# Patient Record
Sex: Female | Born: 1955 | Race: Black or African American | Hispanic: No | Marital: Married | Smoking: Never smoker
Health system: Southern US, Community
[De-identification: ages and names within clinical notes are randomized; demographics above are authoritative.]

## PROBLEM LIST (undated history)

## (undated) DIAGNOSIS — N611 Abscess of the breast and nipple: Secondary | ICD-10-CM

## (undated) DIAGNOSIS — Z8719 Personal history of other diseases of the digestive system: Secondary | ICD-10-CM

## (undated) DIAGNOSIS — J302 Other seasonal allergic rhinitis: Secondary | ICD-10-CM

## (undated) DIAGNOSIS — K219 Gastro-esophageal reflux disease without esophagitis: Secondary | ICD-10-CM

## (undated) DIAGNOSIS — R011 Cardiac murmur, unspecified: Secondary | ICD-10-CM

## (undated) DIAGNOSIS — M25569 Pain in unspecified knee: Secondary | ICD-10-CM

## (undated) DIAGNOSIS — R17 Unspecified jaundice: Secondary | ICD-10-CM

## (undated) DIAGNOSIS — T7840XA Allergy, unspecified, initial encounter: Secondary | ICD-10-CM

## (undated) DIAGNOSIS — L309 Dermatitis, unspecified: Secondary | ICD-10-CM

## (undated) DIAGNOSIS — R519 Headache, unspecified: Secondary | ICD-10-CM

## (undated) DIAGNOSIS — E039 Hypothyroidism, unspecified: Secondary | ICD-10-CM

## (undated) DIAGNOSIS — R7303 Prediabetes: Secondary | ICD-10-CM

## (undated) DIAGNOSIS — E785 Hyperlipidemia, unspecified: Secondary | ICD-10-CM

## (undated) DIAGNOSIS — D649 Anemia, unspecified: Secondary | ICD-10-CM

## (undated) DIAGNOSIS — D219 Benign neoplasm of connective and other soft tissue, unspecified: Secondary | ICD-10-CM

## (undated) DIAGNOSIS — IMO0002 Reserved for concepts with insufficient information to code with codable children: Secondary | ICD-10-CM

## (undated) DIAGNOSIS — G8929 Other chronic pain: Secondary | ICD-10-CM

## (undated) DIAGNOSIS — T8859XA Other complications of anesthesia, initial encounter: Secondary | ICD-10-CM

## (undated) DIAGNOSIS — R42 Dizziness and giddiness: Secondary | ICD-10-CM

## (undated) DIAGNOSIS — R51 Headache: Secondary | ICD-10-CM

## (undated) DIAGNOSIS — M549 Dorsalgia, unspecified: Secondary | ICD-10-CM

## (undated) DIAGNOSIS — Z87440 Personal history of urinary (tract) infections: Secondary | ICD-10-CM

## (undated) DIAGNOSIS — M1990 Unspecified osteoarthritis, unspecified site: Secondary | ICD-10-CM

## (undated) DIAGNOSIS — T4145XA Adverse effect of unspecified anesthetic, initial encounter: Secondary | ICD-10-CM

## (undated) DIAGNOSIS — J40 Bronchitis, not specified as acute or chronic: Secondary | ICD-10-CM

## (undated) HISTORY — DX: Benign neoplasm of connective and other soft tissue, unspecified: D21.9

## (undated) HISTORY — DX: Pain in unspecified knee: M25.569

## (undated) HISTORY — PX: COLONOSCOPY: SHX174

## (undated) HISTORY — DX: Allergy, unspecified, initial encounter: T78.40XA

## (undated) HISTORY — DX: Hyperlipidemia, unspecified: E78.5

## (undated) HISTORY — DX: Unspecified osteoarthritis, unspecified site: M19.90

## (undated) HISTORY — PX: CHOLECYSTECTOMY: SHX55

## (undated) HISTORY — PX: KNEE SURGERY: SHX244

## (undated) HISTORY — PX: TONSILLECTOMY: SUR1361

## (undated) HISTORY — DX: Anemia, unspecified: D64.9

---

## 1974-05-25 DIAGNOSIS — IMO0002 Reserved for concepts with insufficient information to code with codable children: Secondary | ICD-10-CM

## 1974-05-25 HISTORY — DX: Reserved for concepts with insufficient information to code with codable children: IMO0002

## 1986-05-25 HISTORY — PX: TUBAL LIGATION: SHX77

## 1999-02-21 ENCOUNTER — Other Ambulatory Visit: Admission: RE | Admit: 1999-02-21 | Discharge: 1999-02-21 | Payer: Self-pay | Admitting: Obstetrics and Gynecology

## 1999-02-21 ENCOUNTER — Encounter (INDEPENDENT_AMBULATORY_CARE_PROVIDER_SITE_OTHER): Payer: Self-pay

## 1999-08-01 ENCOUNTER — Encounter: Payer: Self-pay | Admitting: Emergency Medicine

## 1999-08-01 ENCOUNTER — Emergency Department (HOSPITAL_COMMUNITY): Admission: EM | Admit: 1999-08-01 | Discharge: 1999-08-01 | Payer: Self-pay | Admitting: Emergency Medicine

## 1999-12-15 ENCOUNTER — Inpatient Hospital Stay (HOSPITAL_COMMUNITY): Admission: EM | Admit: 1999-12-15 | Discharge: 1999-12-19 | Payer: Self-pay | Admitting: Cardiology

## 1999-12-15 ENCOUNTER — Encounter: Payer: Self-pay | Admitting: Emergency Medicine

## 2001-12-28 ENCOUNTER — Other Ambulatory Visit: Admission: RE | Admit: 2001-12-28 | Discharge: 2001-12-28 | Payer: Self-pay | Admitting: Obstetrics and Gynecology

## 2003-05-14 ENCOUNTER — Other Ambulatory Visit: Admission: RE | Admit: 2003-05-14 | Discharge: 2003-05-14 | Payer: Self-pay | Admitting: Obstetrics and Gynecology

## 2004-06-17 ENCOUNTER — Other Ambulatory Visit: Admission: RE | Admit: 2004-06-17 | Discharge: 2004-06-17 | Payer: Self-pay | Admitting: Obstetrics and Gynecology

## 2004-07-24 ENCOUNTER — Ambulatory Visit (HOSPITAL_COMMUNITY): Admission: RE | Admit: 2004-07-24 | Discharge: 2004-07-24 | Payer: Self-pay | Admitting: Internal Medicine

## 2005-02-12 ENCOUNTER — Ambulatory Visit (HOSPITAL_COMMUNITY): Admission: RE | Admit: 2005-02-12 | Discharge: 2005-02-12 | Payer: Self-pay | Admitting: Internal Medicine

## 2006-12-22 ENCOUNTER — Ambulatory Visit (HOSPITAL_COMMUNITY): Admission: RE | Admit: 2006-12-22 | Discharge: 2006-12-22 | Payer: Self-pay | Admitting: Obstetrics and Gynecology

## 2006-12-22 ENCOUNTER — Encounter (INDEPENDENT_AMBULATORY_CARE_PROVIDER_SITE_OTHER): Payer: Self-pay | Admitting: Obstetrics and Gynecology

## 2007-05-26 DIAGNOSIS — D219 Benign neoplasm of connective and other soft tissue, unspecified: Secondary | ICD-10-CM

## 2007-05-26 HISTORY — DX: Benign neoplasm of connective and other soft tissue, unspecified: D21.9

## 2007-06-01 ENCOUNTER — Encounter: Admission: RE | Admit: 2007-06-01 | Discharge: 2007-06-01 | Payer: Self-pay | Admitting: Obstetrics and Gynecology

## 2007-06-08 ENCOUNTER — Encounter: Admission: RE | Admit: 2007-06-08 | Discharge: 2007-06-08 | Payer: Self-pay | Admitting: Interventional Radiology

## 2007-06-22 ENCOUNTER — Ambulatory Visit (HOSPITAL_COMMUNITY): Admission: RE | Admit: 2007-06-22 | Discharge: 2007-06-23 | Payer: Self-pay | Admitting: Diagnostic Radiology

## 2007-07-19 ENCOUNTER — Encounter: Admission: RE | Admit: 2007-07-19 | Discharge: 2007-07-19 | Payer: Self-pay | Admitting: Diagnostic Radiology

## 2007-12-02 ENCOUNTER — Emergency Department (HOSPITAL_COMMUNITY): Admission: EM | Admit: 2007-12-02 | Discharge: 2007-12-02 | Payer: Self-pay | Admitting: Family Medicine

## 2008-02-01 ENCOUNTER — Encounter: Admission: RE | Admit: 2008-02-01 | Discharge: 2008-02-01 | Payer: Self-pay | Admitting: Diagnostic Radiology

## 2008-03-07 ENCOUNTER — Encounter: Admission: RE | Admit: 2008-03-07 | Discharge: 2008-03-07 | Payer: Self-pay | Admitting: Orthopedic Surgery

## 2008-05-01 ENCOUNTER — Ambulatory Visit: Payer: Self-pay | Admitting: Oncology

## 2008-05-14 LAB — MORPHOLOGY: PLT EST: ADEQUATE

## 2008-05-14 LAB — CBC & DIFF AND RETIC
BASO%: 0.4 % (ref 0.0–2.0)
Basophils Absolute: 0 10*3/uL (ref 0.0–0.1)
EOS%: 3.4 % (ref 0.0–7.0)
IRF: 0.4 — ABNORMAL HIGH (ref 0.130–0.330)
MCH: 28.8 pg (ref 26.0–34.0)
MCHC: 36.7 g/dL — ABNORMAL HIGH (ref 32.0–36.0)
MCV: 78.5 fL — ABNORMAL LOW (ref 81.0–101.0)
MONO%: 4.1 % (ref 0.0–13.0)
RDW: 17.5 % — ABNORMAL HIGH (ref 11.3–14.5)
RETIC #: 399.8 10*3/uL — ABNORMAL HIGH (ref 19.7–115.1)
lymph#: 1.2 10*3/uL (ref 0.9–3.3)

## 2008-05-14 LAB — CHCC SMEAR

## 2008-05-16 LAB — IRON AND TIBC
%SAT: 29 % (ref 20–55)
TIBC: 311 ug/dL (ref 250–470)
UIBC: 221 ug/dL

## 2008-05-16 LAB — COMPREHENSIVE METABOLIC PANEL
AST: 26 U/L (ref 0–37)
Albumin: 4.7 g/dL (ref 3.5–5.2)
Alkaline Phosphatase: 67 U/L (ref 39–117)
BUN: 13 mg/dL (ref 6–23)
Calcium: 9.4 mg/dL (ref 8.4–10.5)
Chloride: 101 mEq/L (ref 96–112)
Creatinine, Ser: 0.71 mg/dL (ref 0.40–1.20)
Glucose, Bld: 221 mg/dL — ABNORMAL HIGH (ref 70–99)
Potassium: 3.7 mEq/L (ref 3.5–5.3)

## 2008-05-16 LAB — HEMOGLOBINOPATHY EVALUATION: Hemoglobin Other: 0 % (ref 0.0–0.0)

## 2008-05-16 LAB — TRANSFERRIN RECEPTOR, SOLUABLE: Transferrin Receptor, Soluble: 76.7 nmol/L

## 2008-05-22 ENCOUNTER — Ambulatory Visit (HOSPITAL_COMMUNITY): Admission: RE | Admit: 2008-05-22 | Discharge: 2008-05-22 | Payer: Self-pay | Admitting: Oncology

## 2009-05-25 HISTORY — PX: ENDOMETRIAL ABLATION: SHX621

## 2010-06-16 ENCOUNTER — Encounter: Payer: Self-pay | Admitting: Diagnostic Radiology

## 2010-10-07 NOTE — Op Note (Signed)
NAME:  Carrie [name], Carrie [name]             ACCOUNT NO.:  [id number]   MEDICAL RECORD NO.:  [id number]          PATIENT TYPE:  AMB   LOCATION:  SDC                           FACILITY:  [facility]   PHYSICIAN:  Maxie Better, M.D.DATE OF BIRTH:  29-Jun-1955   DATE OF PROCEDURE:  12/22/2006  DATE OF DISCHARGE:                               OPERATIVE REPORT   PREOPERATIVE DIAGNOSES:  1. Menometrorrhagia.  2. Endometrial mass.   PROCEDURES:  1. Diagnostic hysteroscopy.  2. Hysteroscopic resection of a submucosal fibroid.  3. Dilation and curettage.  4. Endometrial polyp removal.   POSTOPERATIVE DIAGNOSES:  1. Submucosal fibroids.  2. Menometrorrhagia.  3. Endometrial polyp pending final pathology   ANESTHESIA:  General.   SURGEON:  Maxie Better, M.D.   PROCEDURE:  Under adequate general anesthesia, the patient was placed in  the dorsal lithotomy position.  She was sterilely prepped and draped in  usual fashion.  The bladder was catheterized for scant amount of urine.  Examination under anesthesia revealed a retroflexed uterus which was  irregular and about 8-week size.  The adnexa was without any palpable  masses.  Bivalve speculum was  placed in the vagina.  A single-tooth  tenaculum was placed in the anterior lip of the cervix.  The cervix was  then serially dilated up to #27 [facility] dilator.  A sorbitol primed  resectoscope was introduced into the uterine cavity.  Lots of thickened  endometrium, irregular, particularly in the lower uterine segment, was  noted.  It was difficult to visualize distinct polypoid lesion in that  setting.  The right tubal ostia was clearly seen.  The left was less  distinct.  The resectoscope was then removed.  The cavity was then  curetted for large amount of tissue, some of which was polypoid-  appearing in nature.  The resectoscope was then reinserted.  The cavity  was then inspected.  It was then noted that there was a posterior  submucosal fibroid.   The double loop was then used to resect the  fibroid.  The pressure which had initially been increased, was then  decreased to further see the extent of the fibroid.  Additional  resection was then performed when the resection was felt to have been  inadequate.  The resectoscope was then removed.  Pieces of the  submucosal fibroid was removed using polyp forceps.  Cavity was once  again curetted.  The hysteroscope was then reinserted.  Good resection  had been noted.  No other lesions were noted and at that point the  resectoscope was then removed and all instruments were then removed from  the vagina.  Specimen labeled endometrial curetting with polyp was sent  to pathology as well as fibroid resections.   ESTIMATED BLOOD LOSS:  Minimal.   FLUID DEFICIT:  25 mL.   COMPLICATIONS:  None.   The patient tolerated the procedure well and was transferred to the  recovery room in stable condition.      Maxie Better, M.D.  Electronically Signed     [place]/MEDQ  D:  12/22/2006  T:  12/23/2006  Job:  [id number]

## 2010-10-10 NOTE — Discharge Summary (Signed)
[place]. [facility]  Patient:    Carrie [name], Carrie [name]                    MRN: [id number] Adm. Date:  [id number] Disc. Date: [id number] Attending:  Ophelia Shoulder Dictator:   Darcella Gasman. Annie Paras, F.N.P.C. CC:         Lindell Spar. Chestine Spore, M.D.             Griffith Citron, M.D.                           Discharge Summary  DISCHARGE DIAGNOSES: 1. Chest pain; negative myocardial infarction. 2. Anemia.    a. Antritis.    b. Iron deficiency. 3. Family history of coronary disease.  DISCHARGE CONDITION:  Improved.  PROCEDURES: 1. On December 17, 1999, combined left heart catheterization by Dr. Madaline Savage. 2. On December 18, 1999, EGD by Dr. Sharrell Ku with closed biopsy.  DISCHARGE MEDICATIONS: 1. Protonix 40 mg one daily. 2. Allegra-D as needed.  DISCHARGE INSTRUCTIONS: 1. No strenuous activity, no sexual activity.  No lifting over 10 pounds    for two days, then resume regular activity. 2. Low fat diet. 3. Wash catheterization site with soap and water.  Call if any bleeding,    swelling or drainage.  No tub baths or swimming for one week. 4. Followup with Dr. Kinnie Scales in one to two weeks.  Call for an appointment. 5. Followup with Dr. Elsie Lincoln in three to four weeks.  Call the office for an    appointment, date and time.  HISTORY OF PRESENT ILLNESS:  The patient is a 55 year old black female with no prior coronary disease, hypertension, or medical history except for iron deficiency anemia and allergies, but a positive family history for coronary disease, presented to the ER on December 15, 1999.  At 11:30 which dusting furniture she had the onset of chest pain.  She had had chest pain once prior to this several years ago after her mothers death and was told it was anxiety.  On admission, discomfort was precordial and continued low-grade. She had no associated symptoms.  She had left chest pain, dull aching, continued from 11:30 a.m. to 3:30 p.m.  She  took aspirin and Tums at home with little change.  PAST MEDICAL HISTORY:  Seasonal allergies.  OUTPATIENT MEDICATIONS:  Claritin.  SOCIAL HISTORY:  No tobacco.  Occasional alcohol.  She is married.  FAMILY HISTORY:  One brother, two sisters.  No cardiac disease.  Mother and father died of coronary disease in their 56s.  REVIEW OF SYSTEMS:  Last menstrual period was the first week of July and she has had her tubes tied.  See history and physical for other details.  PHYSICAL EXAMINATION AT DISCHARGE:  HEART:  S1, S2, regular rate and rhythm.  LUNGS:  Clear.  ABDOMEN:  Positive bowel sounds.  EXTREMITIES:  Right groin without hematoma, 2+ pedal pulses.  VITAL SIGNS:  Blood pressure 120/78, pulse 88, respirations 16, temperature 98.7, room air oxygen saturation 99%.  LABORATORY DATA:  Vitamin B12 335.  Iron 84.  Total iron-binding capacity 82. Iron saturation 30.  Ferritin 68.  Folate 15.7.  Hemoglobin on admission 9.6, hematocrit 25, WBC 4.7, platelets 266, MCV 73.6.  Prior to discharge, hemoglobin 10.6, hematocrit 28, platelets 315.  Neutrophils 68, lymphs 24, monos 4, eosinophils 2, baso 0, leukocytes 2.  Pro time 15, INR  of 1.3, PTT 28, this was therapeutic on heparin, 30 prior to discharge.  Chemistries:  Sodium 142, potassium 3.7, chloride 106, CO2 27, glucose 92, BUN 11, creatinine 0.7, calcium 9.9.  G6 2-D screen was pending.  Hemoglobin electrophoresis was pending at the time of this dictation.  CK ranged from 120 down to 49.  MB 2.8 to 1.1, troponin I less than 0.03.  Lipid panel: Cholesterol 111, triglycerides 83, HDL 36, LDL 58, TSH was 0.76.  Portable chest x-ray, no acute process.  ECG on admission, sinus rhythm, normal ECG.  A 2-D echocardiogram was done, normal LV systolic function, EF 65%.  Aortic valve within normal limits.  Trace aortic regurgitation.  No mitral regurgitation.  Trivial tricuspid regurgitation.  No effusion or masses.  Cardiac  catheterization:  December 17, 1999, patent coronary arteries, normal LV function.  Perclose was done.  EGD was done December 18, 1999, by Dr. Kinnie Scales which revealed normal esophagus without reflux disease, antritis mild, but felt that the Protonix over the last 72 hours could have resulted in early healing.  There was no lesion to explain anemia.  CLO biopsy was done.  The results are not back on the chart yet.  She would follow up with Dr. Kinnie Scales on an outpatient basis for a colonoscopy and she is to avoid Celebrex, aspirin and nonsteroidals.  HOSPITAL COURSE:  Ms. Navia was admitted by Dr. Elsie Lincoln on December 15, 1999, after having chest pains reported.  Cardiac enzymes were negative.  She was placed on IV heparin and nitroglycerin.  By the next day she was feeling better.  A 2-D echocardiogram was done with no acute abnormalities.  She underwent cardiac catheterization, again patent coronaries and she underwent EGD.  She was found to be anemic, though no specific iron deficiency.  She had been on iron at one time in the past.  After the EGD, once she had recovered, Dr. Kinnie Scales felt she was stable enough to be discharged home and was discharged that evening around 9 p.m.  She would follow up with both Dr. Elsie Lincoln and Dr. Kinnie Scales as an outpatient. DD:  01/11/00 TD:  01/13/00 Job: [id number]

## 2010-10-10 NOTE — Procedures (Signed)
[place]. [facility]  Patient:    Carrie [name], Carrie [name]                      MRN: [id number] Proc. Date: 12/18/99 Attending:  Griffith Citron, M.D. CC:         Madaline Savage, M.D.             Lindell Spar. Chestine Spore, M.D.                           Procedure Report  PROCEDURE PERFORMED:  Panendoscopy with biopsy.  ENDOSCOPIST:  Griffith Citron, M.D.  INDICATIONS:  The patient is a 55 year old African-American female presenting with chest pain three days ago.  Found to have microcytic anemia.  GI symptoms of a dyspepsia.  Undergoing endoscopy to further evaluate possible upper GI pathology.  Risk factors include Celebrex daily, p.r.n. aspirin.  DESCRIPTION OF PROCEDURE:  After reviewing the nature of the procedure with the patient and her family including potential risks and complications and alternatives, informed consent was given.  The patient was premedicated, given Versed 7 mg, fentanyl 50 mcg administered IV in divided doses prior to and during the course of the procedure. Initially given topical oropharyngeal anesthetic.  Using an Olympus video endoscope the proximal esophagus was intubated under direct vision.  Oropharynx was normal without lesion of the epiglottis, vocal cords or piriform sinus.  The esophagus was easily intubated revealing a normal proximal, mid and distal segment.  The mucosal Z-line was distinct at 44 cm.  There was no evidence of esophageal reflux.  No mucosal inflammation, infiltration, or hiatal hernia.  The stomach was entered.  The gastric fundus and body were normal.  The antrum was remarkable for linear streaking and erythema.  There was no evidence of erosion or ulceration.  The pylorus was symmetric.  The duodenal bulb and second portion were normal.  Retroflex view of the angularis, lesser curve, gastric cardia and fundus were negative.  The angularis and prepyloric antrum were biopsied for Helicobacter pylori. The  stomach was then decompressed and scope withdrawn.  The patient tolerated the procedure without difficulty being maintained on Datascope monitor and low-flow oxygen throughout.  Returned to recovery in stable condition.  ASSESSMENT: 1. Normal esophagus without evidence of reflux disease or other primary    esophageal pathology. 2. Antritis--mild.  The degree of inflammation does not explain the patients    chest pain but inflammation may have been significantly worse at the    time of admission.  Patient has been on Protonix for the past 72 hours    which may have resulted in early healing. 3. No lesion to explain anemia.  RECOMMENDATIONS: 1. Follow up CLO biopsy.  Consider treatment if positive. 2. Continue Protonix 40 mg daily. 3. Avoid Celebrex and aspirin and other NSAIDs. 4. ROV one to two weeks.  On return office visit, discuss treatment of    Helicobacter if pertinent.  Also consider pursuing possible gallstone    disease with abdominal ultrasound, colorectal neoplasia with colonoscopy    especially in light of unexplained microcytic anemia and unexplained    pain. DD:  12/18/99 TD:  12/20/99 Job: [id number]

## 2010-10-10 NOTE — Consult Note (Signed)
[place]. [facility]  Patient:    Carrie [name], Carrie [name]                    MRN: [id number] Proc. Date: 12/18/99 Adm. Date:  [id number] Disc. Date: [id number] Attending:  Ophelia Shoulder CC:         Madaline Savage, M.D.             Lindell Spar. Chestine Spore, M.D.                          Consultation Report  REASON FOR CONSULTATION:  I was asked to see Ms. Errington by Dr. Chanda Busing to evaluate chest pain and anemia.  HISTORY OF PRESENT ILLNESS:  Ms. Fujii developed acute upset of substernal/ left precordial chest pain at 11:30 on the morning of admission (3 days ago). This was accompanied by belching and indigestion for which he took Tums and an aspirin.  After 4 hours she came to the [facility] Room and was admitted for possible cardiac induced chest pain.  Evaluation over the past 3 days revealed normal cardiac enzymes and no evolution of EKG changes.  She underwent cardiac catheterization performed by Dr. Elsie Lincoln yesterday with findings of normal coronary arteries and left ventricular function.  I am asked to further evaluate the patient for possible GI etiology of her symptoms.  Since admission chest pain has resolved.  Appetite has remained excellent throughout.  No nausea or vomiting.  Denies pyrosis, water brash, bilious reflux, dysphagia or odynophagia.  No abdominal pain.  The patient does admit to using Tums frequently p.r.n. for mild indigestion. The patient does not have any history of any upper GI disease without GERD or peptic ulcer disease known.  Risks factors include celebrex daily because of low back strain.  She does not use other NSAIDs.  Occasional aspirin use.  The patient denies hematemesis, melena or hematochezia.  Bowel habits are regular, formed stool, once or twice daily.  No symptoms related to food or bowel movements.  The patient has not undergone prior GI evaluation.  FAMILY HISTORY:  Significant for a  mother with peptic ulcer disease.  The other significant problem was that of anemia on admission.  Hemoglobin was 8.0, hematocrit 23, with microcytic hyperchromic indices.  Subsequent iron studies revealed a transferrin saturation of 30% with low normal ferritin of 338.  The patient has no clinical history of bleeding. She was diagnosed with anemia earlier this year and was placed on iron supplements by Dr. Margaretmary Bayley, her primary M.D. in January.  She menstruates monthly, regular, without excess bleeding.  The patient denies excess bruising or bleeding.  No prior history of transfusion.  PAST MEDICAL HISTORY: 1. Significant for anemia, see HPI. 2. Low back strain. 3. Allergies.  CURRENT MEDICAL REGIMEN: 1. allegra 2. Celebrex.  ALLERGIES:  No known drug allergies.  REVIEW OF SYSTEMS:  Noncontributory.  FAMILY HISTORY:  Father deceased from MI.  History of prostate cancer.  Mother deceased from MI, history of peptic ulcer disease, gallbladder disease.  One brother, 2 sisters, both alive and well.  PHYSICAL EXAMINATION:  GENERAL:  Healthy appearing, tall, African American female.  Alert, oriented, no acute distress.  VITAL SIGNS;  Stable.  HEENT:  Anicteric sclerae.  Pink conjunctivae.  EOMI, PERRL.  Mouth: Dentition in good repair.  No oropharyngeal lesion.  NECK:  Supple without adenopathy, thyromegaly or bruits.  CHEST:  Clear to auscultation.  CARDIAC:  Regular rhythm, no gallop, no murmur.  ABDOMEN:  Soft, nontender, nondistended.  Bowel sounds active.  No borborygmi, bruit, or splash.  No hepatosplenomegaly.  No mass, no firmness.  RECTAL:  Not performed.  EXTREMITIES:  No clubbing, cyanosis, or edema.  SKIN:  No lesion.  LABORATORY DATA:  Hemoglobin 8, hematocrit 23, MCV 73.6, MCHC 37.3.  Iron/TIBC transferrin saturation 30%, ferritin 338. Normal studies include BMET, lipid profile, TSH.  ASSESSMENT: #1. CHEST PAIN:  The patients chest pain is  noncardiac and may well be due to     GERD or peptic disease. Celebrex use raises the spectra of NSAID induced     gastropathy.  Endoscopy warranted to further evaluate.  The patient has     been placed on protonix and has been asymptomatic since admission.  #2. ANEMIA:  Uncertain etiology.  Suspect iron deficiency despite normal     transferrin saturation.  This finding may reflect iron supplementation     during the past several months.  Most likely with loss due menstrual blood     losses.  No history to suggest GI losses or other cause.  Nevertheless     endoscopy is indicated to further evaluate.  If this normal consider     colonoscopy.  RECOMMENDATIONS: 1. Endoscopy. 2. Agree with empiric protonix. 3. Further GI evaluation and treatment pending results of endoscopy. 4. Endoscopic procedure was reviewed with the patient and her family including    the potential risks and complications of hemorrhage and perforation,    alternatives of upper GI series.  All questions answered.  The patient    demonstrates her understanding and desire to proceed by giving informed    consent. DD:  12/18/99 TD:  12/20/99 Job: [id number]

## 2011-02-12 LAB — HCG, SERUM, QUALITATIVE: Preg, Serum: NEGATIVE

## 2011-02-12 LAB — CBC
Hemoglobin: 9.9 — ABNORMAL LOW
RBC: 3.4 — ABNORMAL LOW
WBC: 4.9

## 2011-02-12 LAB — CREATININE, SERUM
Creatinine, Ser: 0.71
GFR calc Af Amer: >60
GFR calc non Af Amer: >60

## 2011-03-09 LAB — CBC
MCV: 78.6
Platelets: 338
RBC: 3.55 — ABNORMAL LOW
WBC: 3.9 — ABNORMAL LOW

## 2011-04-13 ENCOUNTER — Encounter (INDEPENDENT_AMBULATORY_CARE_PROVIDER_SITE_OTHER): Payer: Self-pay | Admitting: Surgery

## 2011-04-14 ENCOUNTER — Encounter (INDEPENDENT_AMBULATORY_CARE_PROVIDER_SITE_OTHER): Payer: Self-pay | Admitting: Surgery

## 2011-04-14 ENCOUNTER — Ambulatory Visit (INDEPENDENT_AMBULATORY_CARE_PROVIDER_SITE_OTHER): Payer: BC Managed Care – PPO | Admitting: Surgery

## 2011-04-14 VITALS — BP 123/74 | HR 80 | Temp 99.5°F | Resp 18 | Ht 74.0 in | Wt 208.6 lb

## 2011-04-14 DIAGNOSIS — K8020 Calculus of gallbladder without cholecystitis without obstruction: Secondary | ICD-10-CM

## 2011-04-14 DIAGNOSIS — D649 Anemia, unspecified: Secondary | ICD-10-CM | POA: Insufficient documentation

## 2011-04-14 NOTE — Progress Notes (Signed)
Patient ID: Carrie [name], female   DOB: 1955-12-11, 55 y.o.   MRN: [id number]  Chief Complaint  Patient presents with  . Abdominal Pain    gallbladder    HPI Carrie [name] is a 55 y.o. female.   HPIThis is a very pleasant patient referred by Dr. Renae Gloss for evaluation of symptomatic cholelithiasis. The patient was out of town last week when she developed epigastric abdominal pain nausea and vomiting. She also reports jaundice at that time. She is now pain-free her course of her jaundice is resolving. She is no rebound well. She had an ultrasound done out-of-town but had labs performed.  At the time of her tach her pain did not refer any where else was sharp and colicky in nature  Past Medical History  Diagnosis Date  . Hyperlipidemia   . Knee pain   . Allergy   . Anemia   . Fibroids 2009    embolization of fibroids  . Arthritis   possible hereditary spherocytosis  Past Surgical History  Procedure Date  . Tubal ligation   . Knee surgery     Family History  Problem Relation Age of Onset  . Heart disease Mother     Social History History  Substance Use Topics  . Smoking status: Never Smoker   . Smokeless tobacco: Not on file  . Alcohol Use: Yes    Allergies  Allergen Reactions  . Anesthetics, Amide   . Celecoxib     Current Outpatient Prescriptions  Medication Sig Dispense Refill  . CARAFATE 1 GM/10ML suspension       . diclofenac (VOLTAREN) 0.1 % ophthalmic solution 1 drop 4 (four) times daily.        . ergocalciferol (VITAMIN D2) 50000 UNITS capsule Take 50,000 Units by mouth once a week.        . fluticasone (FLONASE) 50 MCG/ACT nasal spray Place 2 sprays into the nose daily.        Marland Kitchen omeprazole (PRILOSEC) 20 MG capsule Take 20 mg by mouth daily.          Review of Systems Review of Systems  Constitutional: Negative for fever, chills and unexpected weight change.  HENT: Positive for congestion. Negative for hearing loss, sore throat, trouble  swallowing and voice change.   Eyes: Negative for visual disturbance.  Respiratory: Negative for cough and wheezing.   Cardiovascular: Negative for chest pain, palpitations and leg swelling.  Gastrointestinal: Positive for abdominal pain. Negative for nausea, vomiting, diarrhea, constipation, blood in stool, abdominal distention and anal bleeding.  Genitourinary: Negative for hematuria, vaginal bleeding and difficulty urinating.  Musculoskeletal: Negative for arthralgias.  Skin: Negative for rash and wound.  Neurological: Negative for seizures, syncope and headaches.  Hematological: Negative for adenopathy. Does not bruise/bleed easily.  Psychiatric/Behavioral: Negative for confusion.    Blood pressure 123/74, pulse 80, temperature 99.5 F (37.5 C), temperature source Temporal, resp. rate 18, height 6\' 2"  (1.88 m), weight 208 lb 9.6 oz (94.62 kg).  Physical Exam Physical Exam  Constitutional: She is oriented to person, place, and time. She appears well-developed and well-nourished. No distress.  HENT:  Head: Normocephalic and atraumatic.  Right Ear: External ear normal.  Left Ear: External ear normal.  Mouth/Throat: Oropharynx is clear and moist. No oropharyngeal exudate.  Eyes: Conjunctivae are normal. Pupils are equal, round, and reactive to light. Scleral icterus is present.  Neck: Normal range of motion. Neck supple. No tracheal deviation present. No thyromegaly present.  Cardiovascular: Normal rate, normal  heart sounds and intact distal pulses.   No murmur heard. Pulmonary/Chest: Effort normal and breath sounds normal. No respiratory distress. She has no wheezes.  Abdominal: Soft. Bowel sounds are normal. She exhibits no distension and no mass. There is no tenderness. There is no guarding.  Musculoskeletal: Normal range of motion. She exhibits no edema.  Lymphadenopathy:    She has no cervical adenopathy.  Neurological: She is oriented to person, place, and time.  Skin: Skin is  warm and dry. No rash noted. No erythema.  Psychiatric: Her behavior is normal. Judgment normal.    Data Reviewed I had the patient's ultrasound from November 13 demonstrating cholelithiasis. The bile duct is normal without dilation  Assessment    Patient with symptomatic cholelithiasis and possible common bile duct stone. Also with a history of possible spherocytosis    Plan    Given this attack in her jaundice, laparoscopic cholecystectomy with a clinic and is recommended. Check her liver function tests and CBC to check her platelet count preoperatively. Her platelet count is low this may change our approach. I discussed the risk of surgery with her which includes but is not limited to bleeding, infection, bile duct injury, bile leak, need to convert to an open procedure, et Karie Soda. If a stone is present we made an admit her for a possible ERCP. Likely of success is good. Surgery will be scheduled       Carrie [name] A 04/14/2011, 10:31 AM

## 2011-04-21 ENCOUNTER — Telehealth (INDEPENDENT_AMBULATORY_CARE_PROVIDER_SITE_OTHER): Payer: Self-pay | Admitting: General Surgery

## 2011-04-21 NOTE — Telephone Encounter (Signed)
Called pt back and told her that she needed to have her Family Doctor, Dr Renae Gloss to refill her med. She wanted refill on Prilosec and Carafate, per Dr Magnus Ivan she needed her Family Dr to do the refill

## 2011-04-29 ENCOUNTER — Encounter (HOSPITAL_COMMUNITY): Payer: Self-pay | Admitting: Pharmacy Technician

## 2011-05-01 ENCOUNTER — Encounter (HOSPITAL_COMMUNITY): Payer: Self-pay

## 2011-05-01 ENCOUNTER — Other Ambulatory Visit (HOSPITAL_COMMUNITY): Payer: Self-pay

## 2011-05-01 ENCOUNTER — Encounter (HOSPITAL_COMMUNITY)
Admission: RE | Admit: 2011-05-01 | Discharge: 2011-05-01 | Disposition: A | Discharge status: Home / Self Care | Payer: BC Managed Care – PPO | Source: Ambulatory Visit | Attending: Internal Medicine | Admitting: Internal Medicine

## 2011-05-01 HISTORY — DX: Cardiac murmur, unspecified: R01.1

## 2011-05-01 HISTORY — DX: Gastro-esophageal reflux disease without esophagitis: K21.9

## 2011-05-01 HISTORY — DX: Personal history of other diseases of the digestive system: Z87.19

## 2011-05-01 HISTORY — DX: Dermatitis, unspecified: L30.9

## 2011-05-01 HISTORY — DX: Other seasonal allergic rhinitis: J30.2

## 2011-05-01 HISTORY — DX: Bronchitis, not specified as acute or chronic: J40

## 2011-05-01 HISTORY — DX: Other chronic pain: G89.29

## 2011-05-01 HISTORY — DX: Dizziness and giddiness: R42

## 2011-05-01 HISTORY — DX: Other complications of anesthesia, initial encounter: T88.59XA

## 2011-05-01 HISTORY — DX: Reserved for concepts with insufficient information to code with codable children: IMO0002

## 2011-05-01 HISTORY — DX: Personal history of urinary (tract) infections: Z87.440

## 2011-05-01 HISTORY — DX: Hypothyroidism, unspecified: E03.9

## 2011-05-01 HISTORY — DX: Dorsalgia, unspecified: M54.9

## 2011-05-01 HISTORY — DX: Adverse effect of unspecified anesthetic, initial encounter: T41.45XA

## 2011-05-01 LAB — CBC
HCT: 29.8 % — ABNORMAL LOW (ref 36.0–46.0)
Hemoglobin: 10.5 g/dL — ABNORMAL LOW (ref 12.0–15.0)
MCH: 26.6 pg (ref 26.0–34.0)
MCHC: 35.2 g/dL (ref 30.0–36.0)
MCV: 75.6 fL — ABNORMAL LOW (ref 78.0–100.0)
RDW: 18.4 % — ABNORMAL HIGH (ref 11.5–15.5)

## 2011-05-01 NOTE — Pre-Procedure Instructions (Signed)
20 Carrie [name]  05/01/2011   Your procedure is scheduled on: Fri,Dec 14 @ 2:10pm Report to [facility] at 11:10AM.  Call this number if you have problems the morning of surgery: [phone]   Remember:   Do not eat food:After Midnight.  May have clear liquids: up to 4 Hours before arrival.(until 7:10am)  Clear liquids include soda, tea, black coffee, apple or grape juice, broth.  Take these medicines the morning of surgery with A SIP OF WATER: Allegra,Omeprazole,Flonase,Eye Drop   Do not wear jewelry, make-up or nail polish.  Do not wear lotions, powders, or perfumes. You may wear deodorant.  Do not shave 48 hours prior to surgery.  Do not bring valuables to the hospital.  Contacts, dentures or bridgework may not be worn into surgery.  Leave suitcase in the car. After surgery it may be brought to your room.  For patients admitted to the hospital, checkout time is 11:00 AM the day of discharge.   Patients discharged the day of surgery will not be allowed to drive home.  Name and phone number of your driver:   Special Instructions: CHG Shower Use Special Wash: 1/2 bottle night before surgery and 1/2 bottle morning of surgery.   Please read over the following fact sheets that you were given: Pain Booklet, Coughing and Deep Breathing, MRSA Information and Surgical Site Infection Prevention

## 2011-05-01 NOTE — Progress Notes (Signed)
37yrs ago pt was having chest pain at home,came in and a heart cath was done--r/o heart and found out that everything was related to [facility] issues)

## 2011-05-07 MED ORDER — CEFAZOLIN SODIUM-DEXTROSE 2-3 GM-% IV SOLR
2.0000 g | INTRAVENOUS | Status: AC
  Administered 2011-05-08: 2 g via INTRAVENOUS
  Filled 2011-05-07: qty 50

## 2011-05-08 ENCOUNTER — Ambulatory Visit (HOSPITAL_COMMUNITY): Payer: BC Managed Care – PPO | Admitting: Anesthesiology

## 2011-05-08 ENCOUNTER — Other Ambulatory Visit (INDEPENDENT_AMBULATORY_CARE_PROVIDER_SITE_OTHER): Payer: Self-pay | Admitting: Surgery

## 2011-05-08 ENCOUNTER — Encounter (HOSPITAL_COMMUNITY): Payer: Self-pay | Admitting: Anesthesiology

## 2011-05-08 ENCOUNTER — Encounter (HOSPITAL_COMMUNITY)
Admission: RE | Disposition: A | Discharge status: Home / Self Care | Payer: Self-pay | Source: Ambulatory Visit | Attending: Surgery

## 2011-05-08 ENCOUNTER — Encounter (HOSPITAL_COMMUNITY): Payer: Self-pay | Admitting: *Deleted

## 2011-05-08 ENCOUNTER — Ambulatory Visit (HOSPITAL_COMMUNITY): Payer: BC Managed Care – PPO

## 2011-05-08 ENCOUNTER — Ambulatory Visit (HOSPITAL_COMMUNITY)
Admission: RE | Admit: 2011-05-08 | Discharge: 2011-05-08 | Disposition: A | Discharge status: Home / Self Care | Payer: BC Managed Care – PPO | Source: Ambulatory Visit | Attending: Surgery | Admitting: Surgery

## 2011-05-08 DIAGNOSIS — D649 Anemia, unspecified: Secondary | ICD-10-CM | POA: Insufficient documentation

## 2011-05-08 DIAGNOSIS — K8020 Calculus of gallbladder without cholecystitis without obstruction: Secondary | ICD-10-CM | POA: Insufficient documentation

## 2011-05-08 DIAGNOSIS — K219 Gastro-esophageal reflux disease without esophagitis: Secondary | ICD-10-CM | POA: Insufficient documentation

## 2011-05-08 DIAGNOSIS — Z01812 Encounter for preprocedural laboratory examination: Secondary | ICD-10-CM | POA: Insufficient documentation

## 2011-05-08 DIAGNOSIS — K8010 Calculus of gallbladder with chronic cholecystitis without obstruction: Secondary | ICD-10-CM

## 2011-05-08 HISTORY — PX: CHOLECYSTECTOMY: SHX55

## 2011-05-08 SURGERY — LAPAROSCOPIC CHOLECYSTECTOMY WITH INTRAOPERATIVE CHOLANGIOGRAM
Anesthesia: General | Site: Abdomen | Wound class: Clean Contaminated

## 2011-05-08 MED ORDER — BUPIVACAINE-EPINEPHRINE 0.25% -1:200000 IJ SOLN
INTRAMUSCULAR | Status: DC | PRN
  Administered 2011-05-08: 20 mL

## 2011-05-08 MED ORDER — MORPHINE SULFATE 10 MG/ML IJ SOLN
INTRAMUSCULAR | Status: DC | PRN
  Administered 2011-05-08: 5 mg via INTRAVENOUS

## 2011-05-08 MED ORDER — FENTANYL CITRATE 0.05 MG/ML IJ SOLN
INTRAMUSCULAR | Status: DC | PRN
  Administered 2011-05-08 (×2): 50 ug via INTRAVENOUS
  Administered 2011-05-08: 100 ug via INTRAVENOUS
  Administered 2011-05-08: 50 ug via INTRAVENOUS

## 2011-05-08 MED ORDER — PROMETHAZINE HCL 25 MG/ML IJ SOLN: 12.5000 mg | Freq: Four times a day (QID) | INTRAMUSCULAR | Status: DC | PRN

## 2011-05-08 MED ORDER — ROCURONIUM BROMIDE 100 MG/10ML IV SOLN
INTRAVENOUS | Status: DC | PRN
  Administered 2011-05-08: 40 mg via INTRAVENOUS

## 2011-05-08 MED ORDER — DEXAMETHASONE SODIUM PHOSPHATE 4 MG/ML IJ SOLN
INTRAMUSCULAR | Status: DC | PRN
  Administered 2011-05-08: 4 mg via INTRAVENOUS

## 2011-05-08 MED ORDER — SODIUM CHLORIDE 0.9 % IJ SOLN: 3.0000 mL | INTRAMUSCULAR | Status: DC | PRN

## 2011-05-08 MED ORDER — SODIUM CHLORIDE 0.9 % IV SOLN: 250.0000 mL | INTRAVENOUS | Status: DC | PRN

## 2011-05-08 MED ORDER — 0.9 % SODIUM CHLORIDE (POUR BTL) OPTIME
TOPICAL | Status: DC | PRN
  Administered 2011-05-08: 1000 mL

## 2011-05-08 MED ORDER — HYDROMORPHONE HCL PF 1 MG/ML IJ SOLN
0.2500 mg | INTRAMUSCULAR | Status: DC | PRN
  Administered 2011-05-08 (×4): 0.5 mg via INTRAVENOUS

## 2011-05-08 MED ORDER — MORPHINE SULFATE 2 MG/ML IJ SOLN: 2.0000 mg | INTRAMUSCULAR | Status: DC | PRN

## 2011-05-08 MED ORDER — IOHEXOL 300 MG/ML  SOLN
INTRAMUSCULAR | Status: DC | PRN
  Administered 2011-05-08: 1 mL

## 2011-05-08 MED ORDER — LACTATED RINGERS IV SOLN
INTRAVENOUS | Status: DC
  Administered 2011-05-08: 12:00:00 via INTRAVENOUS

## 2011-05-08 MED ORDER — ONDANSETRON HCL 4 MG/2ML IJ SOLN: 4.0000 mg | Freq: Four times a day (QID) | INTRAMUSCULAR | Status: DC | PRN

## 2011-05-08 MED ORDER — ACETAMINOPHEN 650 MG RE SUPP: 650.0000 mg | RECTAL | Status: DC | PRN

## 2011-05-08 MED ORDER — GLYCOPYRROLATE 0.2 MG/ML IJ SOLN
INTRAMUSCULAR | Status: DC | PRN
  Administered 2011-05-08: .6 mg via INTRAVENOUS

## 2011-05-08 MED ORDER — HYDROCODONE-ACETAMINOPHEN 5-325 MG PO TABS: 1.0000 | ORAL_TABLET | ORAL | Status: AC | PRN

## 2011-05-08 MED ORDER — LACTATED RINGERS IV SOLN
INTRAVENOUS | Status: DC | PRN
  Administered 2011-05-08 (×2): via INTRAVENOUS

## 2011-05-08 MED ORDER — ONDANSETRON HCL 4 MG/2ML IJ SOLN
INTRAMUSCULAR | Status: DC | PRN
  Administered 2011-05-08: 4 mg via INTRAVENOUS

## 2011-05-08 MED ORDER — SODIUM CHLORIDE 0.9 % IR SOLN
Status: DC | PRN
  Administered 2011-05-08: 1000 mL

## 2011-05-08 MED ORDER — HYDROMORPHONE HCL PF 1 MG/ML IJ SOLN
INTRAMUSCULAR | Status: AC
  Filled 2011-05-08: qty 1

## 2011-05-08 MED ORDER — SODIUM CHLORIDE 0.9 % IJ SOLN: 3.0000 mL | Freq: Two times a day (BID) | INTRAMUSCULAR | Status: DC

## 2011-05-08 MED ORDER — OXYCODONE HCL 5 MG PO TABS: 5.0000 mg | ORAL_TABLET | ORAL | Status: DC | PRN

## 2011-05-08 MED ORDER — NEOSTIGMINE METHYLSULFATE 1 MG/ML IJ SOLN
INTRAMUSCULAR | Status: DC | PRN
  Administered 2011-05-08: 4 mg via INTRAVENOUS

## 2011-05-08 MED ORDER — MIDAZOLAM HCL 5 MG/5ML IJ SOLN
INTRAMUSCULAR | Status: DC | PRN
  Administered 2011-05-08: 2 mg via INTRAVENOUS

## 2011-05-08 MED ORDER — ACETAMINOPHEN 325 MG PO TABS: 650.0000 mg | ORAL_TABLET | ORAL | Status: DC | PRN

## 2011-05-08 MED ORDER — PROPOFOL 10 MG/ML IV EMUL
INTRAVENOUS | Status: DC | PRN
  Administered 2011-05-08: 200 mg via INTRAVENOUS

## 2011-05-08 MED ORDER — ONDANSETRON HCL 4 MG/2ML IJ SOLN: 4.0000 mg | Freq: Once | INTRAMUSCULAR | Status: DC | PRN

## 2011-05-08 MED ORDER — KETOROLAC TROMETHAMINE 30 MG/ML IJ SOLN
INTRAMUSCULAR | Status: DC | PRN
  Administered 2011-05-08: 30 mg via INTRAVENOUS

## 2011-05-08 SURGICAL SUPPLY — 38 items
APPLIER CLIP 5 13 M/L LIGAMAX5 (MISCELLANEOUS) ×2
BANDAGE ADHESIVE 1X3 (GAUZE/BANDAGES/DRESSINGS) ×8 IMPLANT
BENZOIN TINCTURE PRP APPL 2/3 (GAUZE/BANDAGES/DRESSINGS) ×2 IMPLANT
CANISTER SUCTION 2500CC (MISCELLANEOUS) ×2 IMPLANT
CHLORAPREP W/TINT 26ML (MISCELLANEOUS) ×2 IMPLANT
CLIP APPLIE 5 13 M/L LIGAMAX5 (MISCELLANEOUS) ×1 IMPLANT
CLOTH BEACON ORANGE TIMEOUT ST (SAFETY) ×2 IMPLANT
COVER MAYO STAND STRL (DRAPES) ×2 IMPLANT
COVER SURGICAL LIGHT HANDLE (MISCELLANEOUS) ×2 IMPLANT
DECANTER SPIKE VIAL GLASS SM (MISCELLANEOUS) ×4 IMPLANT
DRAPE C-ARM 42X72 X-RAY (DRAPES) ×2 IMPLANT
ELECT REM PT RETURN 9FT ADLT (ELECTROSURGICAL) ×2
ELECTRODE REM PT RTRN 9FT ADLT (ELECTROSURGICAL) ×1 IMPLANT
GLOVE BIO SURGEON STRL SZ 6.5 (GLOVE) ×2 IMPLANT
GLOVE BIO SURGEON STRL SZ7.5 (GLOVE) ×4 IMPLANT
GLOVE BIOGEL PI IND STRL 6.5 (GLOVE) ×1 IMPLANT
GLOVE BIOGEL PI IND STRL 7.5 (GLOVE) ×2 IMPLANT
GLOVE BIOGEL PI INDICATOR 6.5 (GLOVE) ×1
GLOVE BIOGEL PI INDICATOR 7.5 (GLOVE) ×2
GLOVE SURG SIGNA 7.5 PF LTX (GLOVE) ×2 IMPLANT
GOWN PREVENTION PLUS XLARGE (GOWN DISPOSABLE) ×2 IMPLANT
GOWN STRL NON-REIN LRG LVL3 (GOWN DISPOSABLE) ×10 IMPLANT
KIT BASIN OR (CUSTOM PROCEDURE TRAY) ×2 IMPLANT
KIT ROOM TURNOVER OR (KITS) ×2 IMPLANT
NS IRRIG 1000ML POUR BTL (IV SOLUTION) ×2 IMPLANT
PAD ARMBOARD 7.5X6 YLW CONV (MISCELLANEOUS) ×2 IMPLANT
POUCH SPECIMEN RETRIEVAL 10MM (ENDOMECHANICALS) ×2 IMPLANT
SCISSORS LAP 5X35 DISP (ENDOMECHANICALS) ×2 IMPLANT
SET CHOLANGIOGRAPH 5 50 .035 (SET/KITS/TRAYS/PACK) ×2 IMPLANT
SET IRRIG TUBING LAPAROSCOPIC (IRRIGATION / IRRIGATOR) ×2 IMPLANT
SLEEVE ENDOPATH XCEL 5M (ENDOMECHANICALS) ×4 IMPLANT
SPECIMEN JAR SMALL (MISCELLANEOUS) ×2 IMPLANT
SUT MON AB 4-0 PC3 18 (SUTURE) ×2 IMPLANT
TOWEL OR 17X24 6PK STRL BLUE (TOWEL DISPOSABLE) ×2 IMPLANT
TOWEL OR 17X26 10 PK STRL BLUE (TOWEL DISPOSABLE) ×2 IMPLANT
TRAY LAPAROSCOPIC (CUSTOM PROCEDURE TRAY) ×2 IMPLANT
TROCAR XCEL BLUNT TIP 100MML (ENDOMECHANICALS) ×2 IMPLANT
TROCAR XCEL NON-BLD 5MMX100MML (ENDOMECHANICALS) ×2 IMPLANT

## 2011-05-08 NOTE — Transfer of Care (Signed)
Immediate Anesthesia Transfer of Care Note  Patient: Carrie [name]  Procedure(s) Performed:  LAPAROSCOPIC CHOLECYSTECTOMY WITH INTRAOPERATIVE CHOLANGIOGRAM - LAPAROSCOPIC CHOLECYSTECTOMY WITH INTRAOPERATIVE CHOLANGIOGRAM  Patient Location: PACU  Anesthesia Type: General  Level of Consciousness: awake, alert , oriented and patient cooperative  Airway & Oxygen Therapy: Patient Spontanous Breathing and Patient connected to face mask oxygen  Post-op Assessment: Report given to PACU RN, Post -op Vital signs reviewed and stable and Patient moving all extremities X 4  Post vital signs: Reviewed and stable  Complications: No apparent anesthesia complications

## 2011-05-08 NOTE — Op Note (Signed)
Laparoscopic Cholecystectomy with IOC Procedure Note  Indications: This patient presents with symptomatic gallbladder disease and will undergo laparoscopic cholecystectomy.  Pre-operative Diagnosis: symptomatic cholelithiasis  Post-operative Diagnosis: Same  Surgeon: Abigail Miyamoto A   Assistants:   Anesthesia: General endotracheal anesthesia  ASA Class: 2  Procedure Details  The patient was seen again in the Holding Room. The risks, benefits, complications, treatment options, and expected outcomes were discussed with the patient. The possibilities of reaction to medication, pulmonary aspiration, perforation of viscus, bleeding, recurrent infection, finding a normal gallbladder, the need for additional procedures, failure to diagnose a condition, the possible need to convert to an open procedure, and creating a complication requiring transfusion or operation were discussed with the patient. The likelihood of improving the patient's symptoms with return to their baseline status is good.  The patient and/or family concurred with the proposed plan, giving informed consent. The site of surgery properly noted. The patient was taken to Operating Room, identified as Carrie [name] and the procedure verified as Laparoscopic Cholecystectomy with Intraoperative Cholangiogram. A Time Out was held and the above information confirmed.  Prior to the induction of general anesthesia, antibiotic prophylaxis was administered. General endotracheal anesthesia was then administered and tolerated well. After the induction, the abdomen was prepped with Chloraprep and draped in the sterile fashion. The patient was positioned in the supine position.  Local anesthetic agent was injected into the skin near the umbilicus and an incision made. We dissected down to the abdominal fascia with blunt dissection.  The fascia was incised vertically and we entered the peritoneal cavity bluntly.  A pursestring suture of  0-Vicryl was placed around the fascial opening.  The Hasson cannula was inserted and secured with the stay suture.  Pneumoperitoneum was then created with CO2 and tolerated well without any adverse changes in the patient's vital signs. An 11-mm port was placed in the subxiphoid position.  Two 5-mm ports were placed in the right upper quadrant. All skin incisions were infiltrated with a local anesthetic agent before making the incision and placing the trocars.   We positioned the patient in reverse Trendelenburg, tilted slightly to the patient's left.  The gallbladder was identified, the fundus grasped and retracted cephalad. Adhesions were lysed bluntly and with the electrocautery where indicated, taking care not to injure any adjacent organs or viscus. The infundibulum was grasped and retracted laterally, exposing the peritoneum overlying the triangle of Calot. This was then divided and exposed in a blunt fashion. A critical view of the cystic duct and cystic artery was obtained.  The cystic duct was clearly identified and bluntly dissected circumferentially. The cystic duct was ligated with a clip distally.   An incision was made in the cystic duct and the [facility] cholangiogram catheter introduced. The catheter was secured using a clip. A cholangiogram was then obtained which showed good visualization of the distal and proximal biliary tree with no sign of filling defects or obstruction.  Contrast flowed easily into the duodenum. The catheter was then removed.   The cystic duct was then ligated with clips and divided. The cystic artery was identified, dissected free, ligated with clips and divided as well.   The gallbladder was dissected from the liver bed in retrograde fashion with the electrocautery. The gallbladder was removed and placed in an Endocatch sac. The liver bed was irrigated and inspected. Hemostasis was achieved with the electrocautery. Copious irrigation was utilized and was repeatedly aspirated  until clear.  The gallbladder and Endocatch sac were then  removed through the umbilical port site.  The pursestring suture was used to close the umbilical fascia.    We again inspected the right upper quadrant for hemostasis.  Pneumoperitoneum was released as we removed the trocars.  4-0 Monocryl was used to close the skin.   Benzoin, steri-strips, and clean dressings were applied. The patient was then extubated and brought to the recovery room in stable condition. Instrument, sponge, and needle counts were correct at closure and at the conclusion of the case.   Findings: normal cholangiogram   Estimated Blood Loss: Minimal         Drains: 0         Specimens: Gallbladder           Complications: None; patient tolerated the procedure well.         Disposition: PACU - hemodynamically stable.         Condition: stable

## 2011-05-08 NOTE — Anesthesia Postprocedure Evaluation (Signed)
  Anesthesia Post-op Note  Patient: Carrie [name]  Procedure(s) Performed:  LAPAROSCOPIC CHOLECYSTECTOMY WITH INTRAOPERATIVE CHOLANGIOGRAM - LAPAROSCOPIC CHOLECYSTECTOMY WITH INTRAOPERATIVE CHOLANGIOGRAM  Patient Location: PACU  Anesthesia Type: General  Level of Consciousness: awake, alert , oriented and patient cooperative  Airway and Oxygen Therapy: Patient Spontanous Breathing  Post-op Pain: mild  Post-op Assessment: Post-op Vital signs reviewed, Patient's Cardiovascular Status Stable, Respiratory Function Stable, Patent Airway, No signs of Nausea or vomiting and Adequate PO intake  Post-op Vital Signs: Reviewed and stable  Complications: No apparent anesthesia complications

## 2011-05-08 NOTE — Anesthesia Procedure Notes (Signed)
Procedure Name: Intubation Date/Time: 05/08/2011 1:10 PM Performed by: Leona Singleton A. Oxygen Delivery Method: Circle System Utilized Preoxygenation: Pre-oxygenation with 100% oxygen Intubation Type: IV induction Ventilation: Mask ventilation without difficulty Laryngoscope Size: Miller and 2 Grade View: Grade II Tube type: Oral Tube size: 7.0 mm Number of attempts: 1 Airway Equipment and Method: stylet Placement Confirmation: ETT inserted through vocal cords under direct vision,  breath sounds checked- equal and bilateral and positive ETCO2 Secured at: 21 cm Tube secured with: Tape Dental Injury: Teeth and Oropharynx as per pre-operative assessment

## 2011-05-08 NOTE — Interval H&P Note (Signed)
History and Physical Interval Note: patient reports no change.  Her exam is also unchanged  05/08/2011 12:31 PM  Carrie [name]  has presented today for surgery, with the diagnosis of gallstones  The various methods of treatment have been discussed with the patient and family. After consideration of risks, benefits and other options for treatment, the patient has consented to  Procedure(s): LAPAROSCOPIC CHOLECYSTECTOMY WITH INTRAOPERATIVE CHOLANGIOGRAM as a surgical intervention .  The patients' history has been reviewed, patient examined, no change in status, stable for surgery.  I have reviewed the patients' chart and labs.  Questions were answered to the patient's satisfaction.     Arin Peral A

## 2011-05-08 NOTE — Preoperative (Signed)
Beta Blockers   Reason not to administer Beta Blockers:Not Applicable 

## 2011-05-08 NOTE — H&P (View-Only) (Signed)
Patient ID: Carrie [name], female   DOB: 10/30/1955, 55 y.o.   MRN: [id number]  Chief Complaint  Patient presents with  . Abdominal Pain    gallbladder    HPI Carrie [name] is a 55 y.o. female.   HPIThis is a very pleasant patient referred by Dr. Shelton for evaluation of symptomatic cholelithiasis. The patient was out of town last week when she developed epigastric abdominal pain nausea and vomiting. She also reports jaundice at that time. She is now pain-free her course of her jaundice is resolving. She is no rebound well. She had an ultrasound done out-of-town but had labs performed.  At the time of her tach her pain did not refer any where else was sharp and colicky in nature  Past Medical History  Diagnosis Date  . Hyperlipidemia   . Knee pain   . Allergy   . Anemia   . Fibroids 2009    embolization of fibroids  . Arthritis   possible hereditary spherocytosis  Past Surgical History  Procedure Date  . Tubal ligation   . Knee surgery     Family History  Problem Relation Age of Onset  . Heart disease Mother     Social History History  Substance Use Topics  . Smoking status: Never Smoker   . Smokeless tobacco: Not on file  . Alcohol Use: Yes    Allergies  Allergen Reactions  . Anesthetics, Amide   . Celecoxib     Current Outpatient Prescriptions  Medication Sig Dispense Refill  . CARAFATE 1 GM/10ML suspension       . diclofenac (VOLTAREN) 0.1 % ophthalmic solution 1 drop 4 (four) times daily.        . ergocalciferol (VITAMIN D2) 50000 UNITS capsule Take 50,000 Units by mouth once a week.        . fluticasone (FLONASE) 50 MCG/ACT nasal spray Place 2 sprays into the nose daily.        . omeprazole (PRILOSEC) 20 MG capsule Take 20 mg by mouth daily.          Review of Systems Review of Systems  Constitutional: Negative for fever, chills and unexpected weight change.  HENT: Positive for congestion. Negative for hearing loss, sore throat, trouble  swallowing and voice change.   Eyes: Negative for visual disturbance.  Respiratory: Negative for cough and wheezing.   Cardiovascular: Negative for chest pain, palpitations and leg swelling.  Gastrointestinal: Positive for abdominal pain. Negative for nausea, vomiting, diarrhea, constipation, blood in stool, abdominal distention and anal bleeding.  Genitourinary: Negative for hematuria, vaginal bleeding and difficulty urinating.  Musculoskeletal: Negative for arthralgias.  Skin: Negative for rash and wound.  Neurological: Negative for seizures, syncope and headaches.  Hematological: Negative for adenopathy. Does not bruise/bleed easily.  Psychiatric/Behavioral: Negative for confusion.    Blood pressure 123/74, pulse 80, temperature 99.5 F (37.5 C), temperature source Temporal, resp. rate 18, height 6' 2" (1.88 m), weight 208 lb 9.6 oz (94.62 kg).  Physical Exam Physical Exam  Constitutional: She is oriented to person, place, and time. She appears well-developed and well-nourished. No distress.  HENT:  Head: Normocephalic and atraumatic.  Right Ear: External ear normal.  Left Ear: External ear normal.  Mouth/Throat: Oropharynx is clear and moist. No oropharyngeal exudate.  Eyes: Conjunctivae are normal. Pupils are equal, round, and reactive to light. Scleral icterus is present.  Neck: Normal range of motion. Neck supple. No tracheal deviation present. No thyromegaly present.  Cardiovascular: Normal rate, normal   heart sounds and intact distal pulses.   No murmur heard. Pulmonary/Chest: Effort normal and breath sounds normal. No respiratory distress. She has no wheezes.  Abdominal: Soft. Bowel sounds are normal. She exhibits no distension and no mass. There is no tenderness. There is no guarding.  Musculoskeletal: Normal range of motion. She exhibits no edema.  Lymphadenopathy:    She has no cervical adenopathy.  Neurological: She is oriented to person, place, and time.  Skin: Skin is  warm and dry. No rash noted. No erythema.  Psychiatric: Her behavior is normal. Judgment normal.    Data Reviewed I had the patient's ultrasound from November 13 demonstrating cholelithiasis. The bile duct is normal without dilation  Assessment    Patient with symptomatic cholelithiasis and possible common bile duct stone. Also with a history of possible spherocytosis    Plan    Given this attack in her jaundice, laparoscopic cholecystectomy with a clinic and is recommended. Check her liver function tests and CBC to check her platelet count preoperatively. Her platelet count is low this may change our approach. I discussed the risk of surgery with her which includes but is not limited to bleeding, infection, bile duct injury, bile leak, need to convert to an open procedure, et cetera. If a stone is present we made an admit her for a possible ERCP. Likely of success is good. Surgery will be scheduled       Leelah Hanna A 04/14/2011, 10:31 AM    

## 2011-05-08 NOTE — Anesthesia Preprocedure Evaluation (Addendum)
Anesthesia Evaluation  Patient identified by MRN, date of birth, ID band Patient awake    Reviewed: Allergy & Precautions, H&P , NPO status , Patient's Chart, lab work & pertinent test results  Airway Mallampati: I TM Distance: >3 FB Neck ROM: full    Dental  (+) Teeth Intact and Dental Advisory Given   Pulmonary neg pulmonary ROS,    Pulmonary exam normal       Cardiovascular Exercise Tolerance: Good + Valvular Problems/Murmurs regular Normal    Neuro/Psych Negative Neurological ROS  Negative Psych ROS   GI/Hepatic Neg liver ROS, GERD-  ,  Endo/Other  Negative Endocrine ROS  Renal/GU negative Renal ROS  Genitourinary negative   Musculoskeletal   Abdominal Normal abdominal exam  (+)   Peds  Hematology  (+) anemia ,   Anesthesia Other Findings   Reproductive/Obstetrics                         Anesthesia Physical Anesthesia Plan  ASA: II  Anesthesia Plan: General ETT and General   Post-op Pain Management:    Induction: Intravenous  Airway Management Planned: Oral ETT  Additional Equipment:   Intra-op Plan:   Post-operative Plan: Extubation in OR  Informed Consent: I have reviewed the patients History and Physical, chart, labs and discussed the procedure including the risks, benefits and alternatives for the proposed anesthesia with the patient or authorized representative who has indicated his/her understanding and acceptance.     Plan Discussed with: Anesthesiologist, CRNA and Surgeon  Anesthesia Plan Comments:         Anesthesia Quick Evaluation

## 2011-05-08 NOTE — Discharge Instructions (Signed)
Laparoscopic Cholecystectomy Care After These instructions give you information on caring for yourself after your procedure. Your doctor may also give you more specific instructions. Call your doctor if you have any problems or questions after your procedure. HOME CARE  Change your bandages (dressings) as told by your doctor.   Keep the wound dry and clean. Wash the wound gently with soap and water. Pat the wound dry with a clean towel.   Do not take baths, swim, or use hot tubs for 10 days, or as told by your doctor.   Only take medicine as told by your doctor.   Eat a normal diet as told by your doctor.   Do not lift anything heavier than 25 pounds (11.5 kg), or as told by your doctor.   Do not play contact sports for 1 week, or as told by your doctor.  GET HELP RIGHT AWAY IF:   Your wound is red, puffy (swollen), or painful.   You have yellowish-white fluid (pus) coming from the wound.   You have fluid draining from the wound for more than 1 day.   You have a bad smell coming from the wound.   Your wound breaks open.   You have a rash.   You have trouble breathing.   You have chest pain.   You have a bad reaction to your medicine.   You have a fever.   You have pain in the shoulders (shoulder strap areas).   You feel dizzy or pass out (faint).   You have severe belly (abdominal) pain.   You feel sick to your stomach (nauseous) or throw up (vomit) for more than 1 day.  MAKE SURE YOU:  Understand these instructions.   Will watch your condition.   Will get help right away if you are not doing well or get worse.  Document Released: 02/18/2008 Document Revised: 01/21/2011 Document Reviewed: 10/28/2010 [employer] Patient Information 2012 [employer]. 

## 2011-05-08 NOTE — Progress Notes (Signed)
Reviewed pt d/c instructions with pt who verbalized understanding. D/C pt home via wheelchair with husband

## 2011-05-12 ENCOUNTER — Encounter (HOSPITAL_COMMUNITY): Payer: Self-pay | Admitting: Surgery

## 2011-06-02 ENCOUNTER — Encounter (INDEPENDENT_AMBULATORY_CARE_PROVIDER_SITE_OTHER): Payer: Self-pay | Admitting: Surgery

## 2011-06-02 ENCOUNTER — Ambulatory Visit (INDEPENDENT_AMBULATORY_CARE_PROVIDER_SITE_OTHER): Payer: BC Managed Care – PPO | Admitting: Surgery

## 2011-06-02 VITALS — BP 136/78 | HR 70 | Temp 97.2°F | Resp 18 | Ht 74.0 in | Wt 210.0 lb

## 2011-06-02 DIAGNOSIS — Z09 Encounter for follow-up examination after completed treatment for conditions other than malignant neoplasm: Secondary | ICD-10-CM

## 2011-06-02 NOTE — Progress Notes (Signed)
Subjective:     Patient ID: Carrie [name], female   DOB: August 07, 1955, 56 y.o.   MRN: [id number]  HPI She is here for her first postoperative visit status post laparoscopic cholecystectomy. She is doing well and has no complaints. She is eating well and moving her bowels well.  Review of Systems     Objective:   Physical Exam Her abdomen is soft and her incisions are well healed.  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 I will see her back as needed

## 2011-06-17 ENCOUNTER — Encounter (INDEPENDENT_AMBULATORY_CARE_PROVIDER_SITE_OTHER): Payer: Self-pay

## 2011-07-28 ENCOUNTER — Emergency Department (HOSPITAL_COMMUNITY)
Admission: EM | Admit: 2011-07-28 | Discharge: 2011-07-28 | Discharge status: Absent without leave | Payer: BC Managed Care – PPO | Source: Home / Self Care

## 2012-06-14 ENCOUNTER — Encounter (HOSPITAL_COMMUNITY): Payer: Self-pay | Admitting: *Deleted

## 2012-06-14 ENCOUNTER — Emergency Department (HOSPITAL_COMMUNITY)
Admission: EM | Admit: 2012-06-14 | Discharge: 2012-06-14 | Disposition: A | Discharge status: Home / Self Care | Payer: BC Managed Care – PPO | Attending: Emergency Medicine | Admitting: Emergency Medicine

## 2012-06-14 DIAGNOSIS — IMO0002 Reserved for concepts with insufficient information to code with codable children: Secondary | ICD-10-CM | POA: Insufficient documentation

## 2012-06-14 DIAGNOSIS — K219 Gastro-esophageal reflux disease without esophagitis: Secondary | ICD-10-CM | POA: Insufficient documentation

## 2012-06-14 DIAGNOSIS — R52 Pain, unspecified: Secondary | ICD-10-CM | POA: Insufficient documentation

## 2012-06-14 DIAGNOSIS — H9209 Otalgia, unspecified ear: Secondary | ICD-10-CM | POA: Insufficient documentation

## 2012-06-14 DIAGNOSIS — Z8739 Personal history of other diseases of the musculoskeletal system and connective tissue: Secondary | ICD-10-CM | POA: Insufficient documentation

## 2012-06-14 DIAGNOSIS — Z9851 Tubal ligation status: Secondary | ICD-10-CM | POA: Insufficient documentation

## 2012-06-14 DIAGNOSIS — R509 Fever, unspecified: Secondary | ICD-10-CM | POA: Insufficient documentation

## 2012-06-14 DIAGNOSIS — R51 Headache: Secondary | ICD-10-CM | POA: Insufficient documentation

## 2012-06-14 DIAGNOSIS — Z79899 Other long term (current) drug therapy: Secondary | ICD-10-CM | POA: Insufficient documentation

## 2012-06-14 DIAGNOSIS — Z8742 Personal history of other diseases of the female genital tract: Secondary | ICD-10-CM | POA: Insufficient documentation

## 2012-06-14 DIAGNOSIS — M549 Dorsalgia, unspecified: Secondary | ICD-10-CM | POA: Insufficient documentation

## 2012-06-14 DIAGNOSIS — R5383 Other fatigue: Secondary | ICD-10-CM | POA: Insufficient documentation

## 2012-06-14 DIAGNOSIS — Z8709 Personal history of other diseases of the respiratory system: Secondary | ICD-10-CM | POA: Insufficient documentation

## 2012-06-14 DIAGNOSIS — Z8719 Personal history of other diseases of the digestive system: Secondary | ICD-10-CM | POA: Insufficient documentation

## 2012-06-14 DIAGNOSIS — Z8639 Personal history of other endocrine, nutritional and metabolic disease: Secondary | ICD-10-CM | POA: Insufficient documentation

## 2012-06-14 DIAGNOSIS — B9789 Other viral agents as the cause of diseases classified elsewhere: Secondary | ICD-10-CM | POA: Insufficient documentation

## 2012-06-14 DIAGNOSIS — Z862 Personal history of diseases of the blood and blood-forming organs and certain disorders involving the immune mechanism: Secondary | ICD-10-CM | POA: Insufficient documentation

## 2012-06-14 DIAGNOSIS — R5381 Other malaise: Secondary | ICD-10-CM | POA: Insufficient documentation

## 2012-06-14 DIAGNOSIS — B349 Viral infection, unspecified: Secondary | ICD-10-CM

## 2012-06-14 DIAGNOSIS — G8929 Other chronic pain: Secondary | ICD-10-CM | POA: Insufficient documentation

## 2012-06-14 DIAGNOSIS — Z87448 Personal history of other diseases of urinary system: Secondary | ICD-10-CM | POA: Insufficient documentation

## 2012-06-14 DIAGNOSIS — J3489 Other specified disorders of nose and nasal sinuses: Secondary | ICD-10-CM | POA: Insufficient documentation

## 2012-06-14 DIAGNOSIS — R011 Cardiac murmur, unspecified: Secondary | ICD-10-CM | POA: Insufficient documentation

## 2012-06-14 DIAGNOSIS — Z872 Personal history of diseases of the skin and subcutaneous tissue: Secondary | ICD-10-CM | POA: Insufficient documentation

## 2012-06-14 LAB — CBC WITH DIFFERENTIAL/PLATELET
Eosinophils Absolute: 0.1 10*3/uL (ref 0.0–0.7)
Eosinophils Relative: 1 % (ref 0–5)
Hemoglobin: 10.5 g/dL — ABNORMAL LOW (ref 12.0–15.0)
Lymphocytes Relative: 13 % (ref 12–46)
Lymphs Abs: 0.9 10*3/uL (ref 0.7–4.0)
MCH: 26.9 pg (ref 26.0–34.0)
MCV: 75.4 fL — ABNORMAL LOW (ref 78.0–100.0)
Monocytes Relative: 5 % (ref 3–12)
Neutrophils Relative %: 81 % — ABNORMAL HIGH (ref 43–77)
Platelets: 236 10*3/uL (ref 150–400)
RBC: 3.91 MIL/uL (ref 3.87–5.11)
WBC: 6.6 10*3/uL (ref 4.0–10.5)

## 2012-06-14 LAB — BASIC METABOLIC PANEL
BUN: 14 mg/dL (ref 6–23)
CO2: 27 mEq/L (ref 19–32)
Glucose, Bld: 151 mg/dL — ABNORMAL HIGH (ref 70–99)
Potassium: 3.5 mEq/L (ref 3.5–5.1)
Sodium: 133 mEq/L — ABNORMAL LOW (ref 135–145)

## 2012-06-14 MED ORDER — KETOROLAC TROMETHAMINE 30 MG/ML IJ SOLN
30.0000 mg | Freq: Once | INTRAMUSCULAR | Status: AC
  Administered 2012-06-14: 30 mg via INTRAVENOUS
  Filled 2012-06-14: qty 1

## 2012-06-14 MED ORDER — ONDANSETRON 4 MG PO TBDP: 8.0000 mg | ORAL_TABLET | Freq: Three times a day (TID) | ORAL | Status: DC | PRN

## 2012-06-14 MED ORDER — ONDANSETRON HCL 4 MG/2ML IJ SOLN
4.0000 mg | Freq: Once | INTRAMUSCULAR | Status: AC
  Administered 2012-06-14: 4 mg via INTRAVENOUS
  Filled 2012-06-14: qty 2

## 2012-06-14 MED ORDER — SODIUM CHLORIDE 0.9 % IV BOLUS (SEPSIS)
1000.0000 mL | Freq: Once | INTRAVENOUS | Status: AC
  Administered 2012-06-14: 1000 mL via INTRAVENOUS

## 2012-06-14 MED ORDER — DIPHENHYDRAMINE HCL 50 MG/ML IJ SOLN
25.0000 mg | Freq: Once | INTRAMUSCULAR | Status: AC
  Administered 2012-06-14: 25 mg via INTRAVENOUS
  Filled 2012-06-14: qty 1

## 2012-06-14 MED ORDER — CYCLOBENZAPRINE HCL 10 MG PO TABS: 10.0000 mg | ORAL_TABLET | Freq: Two times a day (BID) | ORAL | Status: DC | PRN

## 2012-06-14 MED ORDER — ACETAMINOPHEN 500 MG PO TABS
1000.0000 mg | ORAL_TABLET | Freq: Once | ORAL | Status: AC
  Administered 2012-06-14: 1000 mg via ORAL
  Filled 2012-06-14: qty 1

## 2012-06-14 NOTE — ED Provider Notes (Signed)
History     CSN: [id number]  Arrival date & time 06/14/12  1023   First MD Initiated Contact with Patient 06/14/12 1139      Chief Complaint  Patient presents with  . Emesis  . Headache  . Weakness    (Consider location/radiation/quality/duration/timing/severity/associated sxs/prior treatment) HPI Comments: Patient is a 57 year old female who presents with multiple complaints for the past week. Patient reports gradual onset of ear ache one week ago. Patient also complains for nausea, vomiting, subjective fever, body aches, and congestion. Patient reports getting amoxicillin for a possible ear infection from her doctor. Patient reports acutely worsening nausea and vomiting yesterday. Today she feels better. She received her flu shot this year. No aggravating/alleviating factors. Patient tried alka seltzer cold and flu for her symptoms which provided some relief.   Patient is a 57 y.o. female presenting with vomiting, headaches, and weakness.  Emesis  Associated symptoms include abdominal pain, chills and headaches.  Headache  Associated symptoms include nausea and vomiting.  Weakness The primary symptoms include headaches, nausea and vomiting.  The headache is associated with weakness.  Additional symptoms include weakness.    Past Medical History  Diagnosis Date  . Hyperlipidemia   . Knee pain   . Allergy   . Fibroids 2009    embolization of fibroids  . Cyst 1976    left arm   . Complication of anesthesia     increased Heart Rate after knee surgery  . Heart murmur   . Bronchitis 3+yrs ago  . Seasonal allergies     takes Allegra as needed as well as using Flonase  . Vertigo   . Arthritis     back  . Chronic back pain     hx of;pt lost weight and does yoga and back pain gone  . Eczema   . GERD (gastroesophageal reflux disease)     takes Omeprazole daily prn  . History of bladder infections 25+yrs ago    used to see a urologist  . History of IBS   . Anemia    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  . Hypothyroidism     hx of 30+yrs ago;was on meds x couple of months and has been fine since    Past Surgical History  Procedure Date  . Knee surgery 15+yrs ago    left knee  . Tonsillectomy     and adenoids as child  . Endometrial ablation 2011  . Tubal ligation 1988  . Colonoscopy   . Cholecystectomy 05/08/2011    Procedure: LAPAROSCOPIC CHOLECYSTECTOMY WITH INTRAOPERATIVE CHOLANGIOGRAM;  Surgeon: Shelly Rubenstein, MD;  Location: [facility] OR;  Service: General;  Laterality: N/A;  LAPAROSCOPIC CHOLECYSTECTOMY WITH INTRAOPERATIVE CHOLANGIOGRAM    Family History  Problem Relation Age of Onset  . Heart disease Mother   . Anesthesia problems Neg Hx   . Hypotension Neg Hx   . Malignant hyperthermia Neg Hx   . Pseudochol deficiency Neg Hx     History  Substance Use Topics  . Smoking status: Never Smoker   . Smokeless tobacco: Never Used  . Alcohol Use: Yes     Comment: occasional wine    OB History    Grav Para Term Preterm Abortions TAB SAB Ect Mult Living                  Review of Systems  Constitutional: Positive for chills and fatigue.  HENT: Positive for ear pain and congestion.  Gastrointestinal: Positive for nausea, vomiting and abdominal pain.  Neurological: Positive for weakness and headaches.  All other systems reviewed and are negative.    Allergies  Anesthetics, amide and Celecoxib  Home Medications   Current Outpatient Rx  Name  Route  Sig  Dispense  Refill  . AMOXICILLIN 500 MG PO CAPS   Oral   Take 500 mg by mouth 3 (three) times daily. Started on 06-12-12 for 10 day supply         . VITAMIN C 100 MG PO TABS   Oral   Take 100 mg by mouth daily.           Marland Kitchen VITAMIN D 1000 UNITS PO TABS   Oral   Take 1,000 Units by mouth daily.           . ALLEGRA-D 24 HOUR PO   Oral   Take 1 capsule by mouth daily as needed. FOR CONGESTION          . FLUTICASONE PROPIONATE 50 MCG/ACT NA SUSP    Nasal   Place 2 sprays into the nose daily. USE AT BEDTIME         . FOLIC ACID 1 MG PO TABS   Oral   Take 1 mg by mouth daily.           Marland Kitchen OMEPRAZOLE 20 MG PO CPDR   Oral   Take 20 mg by mouth daily.             BP 141/76  Pulse 104  Temp 100.4 F (38 C) (Oral)  Resp 18  SpO2 100%  Physical Exam  Nursing note and vitals reviewed. Constitutional: She is oriented to person, place, and time. She appears well-developed and well-nourished. No distress.  HENT:  Head: Normocephalic and atraumatic.  Mouth/Throat: Oropharynx is clear and moist. No oropharyngeal exudate.  Eyes: Conjunctivae normal and EOM are normal. Pupils are equal, round, and reactive to light. No scleral icterus.  Neck: Normal range of motion. Neck supple.  Cardiovascular: Normal rate and regular rhythm.  Exam reveals no gallop and no friction rub.   No murmur heard. Pulmonary/Chest: Effort normal and breath sounds normal. She has no wheezes. She has no rales. She exhibits no tenderness.  Abdominal: Soft. She exhibits no distension. There is no tenderness. There is no rebound.  Musculoskeletal: Normal range of motion.  Neurological: She is alert and oriented to person, place, and time.       Speech is goal-oriented. Moves limbs without ataxia.   Skin: Skin is warm and dry. She is not diaphoretic.  Psychiatric: She has a normal mood and affect. Her behavior is normal.    ED Course  Procedures (including critical care time)  Labs Reviewed  CBC WITH DIFFERENTIAL - Abnormal; Notable for the following:    Hemoglobin 10.5 (*)     HCT 29.5 (*)     MCV 75.4 (*)     RDW 19.9 (*)     Neutrophils Relative 81 (*)     All other components within normal limits  BASIC METABOLIC PANEL - Abnormal; Notable for the following:    Sodium 133 (*)     Chloride 95 (*)     Glucose, Bld 151 (*)     All other components within normal limits   No results found.   1. Viral illness       MDM  1:57 PM Labs  unremarkable. Patient feels better after receiving fluids, toradol, and zofran. Patient  will be discharged after her liter of fluids has finished. Patient likely has a viral illness and will be treated symptomatically. Patient will be discharged with zofran and flexeril. Patient instructed to return with worsening or concerning symptoms. No further evaluation needed at this time. Vitals stable for discharge.         Emilia Beck, PA-C 06/14/12 2000

## 2012-06-14 NOTE — ED Notes (Signed)
Pt states went to doctor on Sunday for ear aches, was given amoxicillin, took dose yesterday morning and that is all she has been able to keep down, started having nausea, vomiting, chills, fever, abdominal pain, denies diarrhea, last BM yesterday. Pt states having congestion, denies sore throat. Last time vomited was this morning. Pt states she feels weak. Pt did have her flu shot.

## 2012-06-15 NOTE — ED Provider Notes (Signed)
Medical screening examination/treatment/procedure(s) were performed by non-physician practitioner and as supervising physician I was immediately available for consultation/collaboration.   Shelda Jakes, MD 06/15/12 [phone]

## 2013-01-02 ENCOUNTER — Encounter (HOSPITAL_COMMUNITY): Payer: Self-pay | Admitting: Emergency Medicine

## 2013-01-02 ENCOUNTER — Emergency Department (HOSPITAL_COMMUNITY): Payer: BC Managed Care – PPO

## 2013-01-02 ENCOUNTER — Emergency Department (HOSPITAL_COMMUNITY)
Admission: EM | Admit: 2013-01-02 | Discharge: 2013-01-02 | Disposition: A | Discharge status: Home / Self Care | Payer: BC Managed Care – PPO | Attending: Emergency Medicine | Admitting: Emergency Medicine

## 2013-01-02 DIAGNOSIS — R51 Headache: Secondary | ICD-10-CM | POA: Insufficient documentation

## 2013-01-02 DIAGNOSIS — E039 Hypothyroidism, unspecified: Secondary | ICD-10-CM | POA: Insufficient documentation

## 2013-01-02 DIAGNOSIS — Z888 Allergy status to other drugs, medicaments and biological substances status: Secondary | ICD-10-CM | POA: Insufficient documentation

## 2013-01-02 DIAGNOSIS — Z8719 Personal history of other diseases of the digestive system: Secondary | ICD-10-CM | POA: Insufficient documentation

## 2013-01-02 DIAGNOSIS — H9209 Otalgia, unspecified ear: Secondary | ICD-10-CM | POA: Insufficient documentation

## 2013-01-02 DIAGNOSIS — M129 Arthropathy, unspecified: Secondary | ICD-10-CM | POA: Insufficient documentation

## 2013-01-02 DIAGNOSIS — G8929 Other chronic pain: Secondary | ICD-10-CM | POA: Insufficient documentation

## 2013-01-02 DIAGNOSIS — R011 Cardiac murmur, unspecified: Secondary | ICD-10-CM | POA: Insufficient documentation

## 2013-01-02 DIAGNOSIS — Z8742 Personal history of other diseases of the female genital tract: Secondary | ICD-10-CM | POA: Insufficient documentation

## 2013-01-02 DIAGNOSIS — K219 Gastro-esophageal reflux disease without esophagitis: Secondary | ICD-10-CM | POA: Insufficient documentation

## 2013-01-02 DIAGNOSIS — M549 Dorsalgia, unspecified: Secondary | ICD-10-CM | POA: Insufficient documentation

## 2013-01-02 DIAGNOSIS — L259 Unspecified contact dermatitis, unspecified cause: Secondary | ICD-10-CM | POA: Insufficient documentation

## 2013-01-02 DIAGNOSIS — J309 Allergic rhinitis, unspecified: Secondary | ICD-10-CM | POA: Insufficient documentation

## 2013-01-02 DIAGNOSIS — Z79899 Other long term (current) drug therapy: Secondary | ICD-10-CM | POA: Insufficient documentation

## 2013-01-02 DIAGNOSIS — Z9109 Other allergy status, other than to drugs and biological substances: Secondary | ICD-10-CM | POA: Insufficient documentation

## 2013-01-02 DIAGNOSIS — Z87440 Personal history of urinary (tract) infections: Secondary | ICD-10-CM | POA: Insufficient documentation

## 2013-01-02 DIAGNOSIS — E785 Hyperlipidemia, unspecified: Secondary | ICD-10-CM | POA: Insufficient documentation

## 2013-01-02 DIAGNOSIS — M542 Cervicalgia: Secondary | ICD-10-CM

## 2013-01-02 DIAGNOSIS — D649 Anemia, unspecified: Secondary | ICD-10-CM | POA: Insufficient documentation

## 2013-01-02 MED ORDER — OXYCODONE-ACETAMINOPHEN 5-325 MG PO TABS: 1.0000 | ORAL_TABLET | Freq: Four times a day (QID) | ORAL | Status: DC | PRN

## 2013-01-02 MED ORDER — DIAZEPAM 5 MG PO TABS: 5.0000 mg | ORAL_TABLET | Freq: Four times a day (QID) | ORAL | Status: DC | PRN

## 2013-01-02 NOTE — ED Provider Notes (Signed)
CSN: [id number]     Arrival date & time 01/02/13  0730 History     First MD Initiated Contact with Patient 01/02/13 [phone]     Chief Complaint  Patient presents with  . Otalgia  . Migraine   (Consider location/radiation/quality/duration/timing/severity/associated sxs/prior Treatment) Patient is a 57 y.o. female presenting with ear pain and migraines. The history is provided by the patient.  Otalgia Associated symptoms: headaches and neck pain   Associated symptoms: no abdominal pain, no diarrhea, no rash and no vomiting   Migraine Associated symptoms include headaches. Pertinent negatives include no chest pain, no abdominal pain and no shortness of breath.   patient has had right-sided neck pain somewhat going to her jaw for the last 3 weeks. She's seen urgent care and a dentist for it. No relief with anti-inflammatories. No fevers. Is worse in the evening. It is worse with movement. No numbness weakness no trouble seeing. No fevers. No difficulty hearing. No confusion.  Past Medical History  Diagnosis Date  . Hyperlipidemia   . Knee pain   . Allergy   . Fibroids 2009    embolization of fibroids  . Cyst 1976    left arm   . Complication of anesthesia     increased Heart Rate after knee surgery  . Heart murmur   . Bronchitis 3+yrs ago  . Seasonal allergies     takes Allegra as needed as well as using Flonase  . Vertigo   . Arthritis     back  . Chronic back pain     hx of;pt lost weight and does yoga and back pain gone  . Eczema   . GERD (gastroesophageal reflux disease)     takes Omeprazole daily prn  . History of bladder infections 25+yrs ago    used to see a urologist  . History of IBS   . Anemia     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  . Hypothyroidism     hx of 30+yrs ago;was on meds x couple of months and has been fine since   Past Surgical History  Procedure Laterality Date  . Knee surgery  15+yrs ago    left knee  . Tonsillectomy      and  adenoids as child  . Endometrial ablation  2011  . Tubal ligation  1988  . Colonoscopy    . Cholecystectomy  05/08/2011    Procedure: LAPAROSCOPIC CHOLECYSTECTOMY WITH INTRAOPERATIVE CHOLANGIOGRAM;  Surgeon: Shelly Rubenstein, MD;  Location: [facility];  Service: General;  Laterality: N/A;  LAPAROSCOPIC CHOLECYSTECTOMY WITH INTRAOPERATIVE CHOLANGIOGRAM   Family History  Problem Relation Age of Onset  . Heart disease Mother   . Anesthesia problems Neg Hx   . Hypotension Neg Hx   . Malignant hyperthermia Neg Hx   . Pseudochol deficiency Neg Hx    History  Substance Use Topics  . Smoking status: Never Smoker   . Smokeless tobacco: Never Used  . Alcohol Use: Yes     Comment: occasional wine   OB History   Grav Para Term Preterm Abortions TAB SAB Ect Mult Living                 Review of Systems  Constitutional: Negative for activity change and appetite change.  HENT: Positive for ear pain, neck pain and neck stiffness.   Eyes: Negative for pain and visual disturbance.  Respiratory: Negative for chest tightness and shortness of breath.   Cardiovascular: Negative for chest pain  and leg swelling.  Gastrointestinal: Negative for nausea, vomiting, abdominal pain and diarrhea.  Genitourinary: Negative for flank pain.  Musculoskeletal: Negative for back pain.  Skin: Negative for rash.  Neurological: Positive for headaches. Negative for weakness and numbness.    Allergies  Anesthetics, amide and Celecoxib  Home Medications   Current Outpatient Rx  Name  Route  Sig  Dispense  Refill  . Ascorbic Acid (VITAMIN C) 100 MG tablet   Oral   Take 100 mg by mouth daily.           . Calcium-Magnesium-Vitamin D (CALCIUM MAGNESIUM PO)   Oral   Take 1 tablet by mouth 2 (two) times a week.         . cholecalciferol (VITAMIN D) 1000 UNITS tablet   Oral   Take 1,000 Units by mouth daily.           . cyclobenzaprine (FLEXERIL) 10 MG tablet   Oral   Take 10 mg by mouth 3 (three) times  daily as needed for muscle spasms.         Marland Kitchen Fexofenadine-Pseudoephedrine (ALLEGRA-D 24 HOUR PO)   Oral   Take 1 capsule by mouth daily as needed. FOR CONGESTION          . fluticasone (FLONASE) 50 MCG/ACT nasal spray   Nasal   Place 2 sprays into the nose daily as needed for rhinitis.          . folic acid (FOLVITE) 1 MG tablet   Oral   Take 1 mg by mouth daily.           Marland Kitchen GLUCOSAMINE HCL PO   Oral   Take 1 tablet by mouth 3 (three) times a week.         . diazepam (VALIUM) 5 MG tablet   Oral   Take 1 tablet (5 mg total) by mouth every 6 (six) hours as needed (spasm).   10 tablet   0   . oxyCODONE-acetaminophen (PERCOCET/ROXICET) 5-325 MG per tablet   Oral   Take 1-2 tablets by mouth every 6 (six) hours as needed for pain.   10 tablet   0    BP 138/76  Pulse 73  Temp(Src) 97.5 F (36.4 C) (Oral)  Resp 18  SpO2 100% Physical Exam  Nursing note and vitals reviewed. Constitutional: She is oriented to person, place, and time. She appears well-developed and well-nourished.  HENT:  Head: Normocephalic and atraumatic.  Right Ear: External ear normal.  Left Ear: External ear normal.  No tenderness over TMJ. Good dentition  Eyes: EOM are normal. Pupils are equal, round, and reactive to light.  Neck: Normal range of motion. Neck supple.  Tenderness to right posterior neck proximally. Mildly decreased range of motion.   Cardiovascular: Normal rate, regular rhythm and normal heart sounds.   No murmur heard. Pulmonary/Chest: Effort normal and breath sounds normal. No respiratory distress. She has no wheezes. She has no rales.  Abdominal: Soft. Bowel sounds are normal. She exhibits no distension. There is no tenderness. There is no rebound and no guarding.  Musculoskeletal: Normal range of motion.  Neurological: She is alert and oriented to person, place, and time. No cranial nerve deficit.  Sensation intact distally. Good grip strength bilaterally.  Skin: Skin is  warm and dry.  Psychiatric: She has a normal mood and affect. Her speech is normal.    ED Course   Procedures (including critical care time)  Labs Reviewed - No data to  display Dg Cervical Spine Complete  01/02/2013   *RADIOLOGY REPORT*  Clinical Data: Right-sided neck and arm pain for several weeks, no injury  CERVICAL SPINE - COMPLETE 4+ VIEW  Comparison: none  Findings: Normal antral posterior alignment.  No prevertebral soft tissue swelling.  Mild C5-6 degenerative disc disease.  Mild calcification right carotid bulb.  IMPRESSION: Mild degenerative change.  No acute findings.   Original Report Authenticated By: Esperanza Heir, M.D.   1. Neck pain     MDM  Right-sided neck pain. Likely musculoskeletal. Will give medicines. Has been on anti-inflammatories  Juliet Rude. Rubin Payor, MD 01/02/13 1623

## 2013-01-02 NOTE — ED Notes (Signed)
Rt ear pain and head ache and neck pain x 3 weeks has been to [facility] thinking ear infection and  and dentist thinking dental pain but has had no relief now pain goes down head to neck to shoulder  And back of head

## 2013-08-07 ENCOUNTER — Ambulatory Visit: Payer: Self-pay | Admitting: General Practice

## 2013-08-23 ENCOUNTER — Ambulatory Visit: Payer: Self-pay | Admitting: General Practice

## 2014-02-08 ENCOUNTER — Emergency Department (HOSPITAL_COMMUNITY)
Admission: EM | Admit: 2014-02-08 | Discharge: 2014-02-08 | Disposition: A | Discharge status: Home / Self Care | Payer: BC Managed Care – PPO | Source: Home / Self Care | Attending: Emergency Medicine | Admitting: Emergency Medicine

## 2014-02-08 ENCOUNTER — Encounter (HOSPITAL_COMMUNITY): Payer: Self-pay | Admitting: Emergency Medicine

## 2014-02-08 DIAGNOSIS — M545 Low back pain, unspecified: Secondary | ICD-10-CM

## 2014-02-08 MED ORDER — IBUPROFEN 800 MG PO TABS
ORAL_TABLET | ORAL | Status: AC
  Filled 2014-02-08: qty 1

## 2014-02-08 MED ORDER — IBUPROFEN 800 MG PO TABS
800.0000 mg | ORAL_TABLET | Freq: Once | ORAL | Status: AC
  Administered 2014-02-08: 800 mg via ORAL

## 2014-02-08 MED ORDER — HYDROCODONE-ACETAMINOPHEN 5-325 MG PO TABS: ORAL_TABLET | ORAL | Status: DC

## 2014-02-08 MED ORDER — DICLOFENAC SODIUM 75 MG PO TBEC: 75.0000 mg | DELAYED_RELEASE_TABLET | Freq: Two times a day (BID) | ORAL | Status: DC

## 2014-02-08 MED ORDER — CYCLOBENZAPRINE HCL 5 MG PO TABS: 5.0000 mg | ORAL_TABLET | Freq: Three times a day (TID) | ORAL | Status: DC | PRN

## 2014-02-08 NOTE — ED Provider Notes (Signed)
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 The patient states she's had pain off-and-on her about 31 years. She has an episode about once year lasting for several weeks or a month. She denies any injury. She notes some numbness and tingling in both eyes. The pain is worse with sitting, but also with bending, lifting, twisting. It's better if she lies flat, stands straight upright, or use ice or heat she denies any numbness or tingling down lower than the knees, muscle weakness, bladder or bowel dysfunction, or saddle anesthesia. She's had no bowel pain, and no fever, chills, or weight loss.  Review of Systems   Other than as noted above, the patient denies any of the following symptoms: Systemic:  No fever, chills, or unexplained weight loss. GI:  No abdominal pain or incontinence of bowel. GU:  No dysuria, frequency, urgency, or hematuria. No incontinence of urine or urinary retention.  M-S:  No neck pain or arthritis. Neuro:  No paresthesias, headache, saddle anesthesia, muscular weakness, or progressive neurological deficit.  [name]   Past medical history, family history, social history, meds, and allergies were reviewed. Specifically, there is no history of cancer, major trauma, osteoporosis, immunosuppression, or HIV infection. She is allergic to Celebrex but can take other nonsteroidal anti-inflammatory drugs. She takes Flonase and Allegra-D for allergies.  Physical Examination    Vital signs:  BP 145/87  Pulse 94  Temp(Src) 98.3 F (36.8 C) (Oral)  Resp 18  SpO2 100% General:  Alert, oriented, in no distress. Abdomen:  Soft, non-tender.  No organomegaly or mass.  No pulsatile midline abdominal mass or bruit. Back:  There is no pain to palpation in the lower back. Her back has a limited range of motion with  45 of flexion, 5 of extension, 5 of lateral bending, and 30 of rotation with pain. Straight leg raising produces pain in the buttocks and lower back but no pain radiating down the legs. Neuro:  Normal muscle strength and sensation, DTRs were unobtainable in the knees and ankles. Extremities: Pedal pulses were full, there was no edema. Skin:  Clear, warm and dry.  No rash.  Course in [facility] [place]   The following medications were given:  Medications  ibuprofen (ADVIL,MOTRIN) tablet 800 mg (800 mg Oral Given 02/08/14 1301)   Assessment   The encounter diagnosis was Bilateral low back pain without sciatica.  No evidence of cauda equina syndrome, discitis, epidural abscess, or aneurism.    Plan     1.  Meds:  The following meds were prescribed:   Discharge Medication List as of 02/08/2014 12:47 PM    START taking these medications   Details  !! cyclobenzaprine (FLEXERIL) 5 MG tablet Take 1 tablet (5 mg total) by mouth 3 (three) times daily as needed for muscle spasms., Starting 02/08/2014, Until Discontinued, Normal    diclofenac (VOLTAREN) 75 MG EC tablet Take 1 tablet (75 mg total) by mouth 2 (two) times daily., Starting 02/08/2014, Until Discontinued, Normal    HYDROcodone-acetaminophen (NORCO/VICODIN) 5-325 MG per tablet 1 to 2 tabs every 4 to 6 hours as needed for pain., Print     !! - Potential duplicate medications found. Please discuss with provider.      2.  Patient Education/Counseling:  The patient was given appropriate handouts, self care instructions, and  instructed in symptomatic relief. The patient was encouraged to try to be as active as possible and given some exercises to do followed by moist heat.  3.  Follow up:  The patient was told to follow up here if no better in 3 to 4 days, or sooner if becoming worse in any way, and given some red flag symptoms such as worsening pain or new neurological symptoms which would prompt immediate return.  Follow up with  orthopedics in one month if no improvement.     Harden Mo, MD 02/08/14 [phone]

## 2014-02-08 NOTE — ED Notes (Signed)
Pt   Reports   Symptoms  Of  Back  Pain    X   sev  Days       denys  Recent  Injury  But  Reports  Has  Had  Back  Problems  In  Past         Walks  Slowly  With   A   Slow  Gait  Pt  Reports  Pain  Worse  On  Certain movements /  posistions

## 2014-02-08 NOTE — Discharge Instructions (Signed)
Do exercises twice daily followed by moist heat for 15 minutes. ° ° ° ° ° °Try to be as active as possible. ° °If no better in 2 weeks, follow up with orthopedist. ° ° °

## 2014-06-08 ENCOUNTER — Encounter (HOSPITAL_COMMUNITY): Payer: Self-pay

## 2014-06-08 ENCOUNTER — Emergency Department (HOSPITAL_COMMUNITY)
Admission: EM | Admit: 2014-06-08 | Discharge: 2014-06-08 | Disposition: A | Discharge status: Home / Self Care | Payer: BLUE CROSS/BLUE SHIELD | Source: Home / Self Care | Attending: Emergency Medicine | Admitting: Emergency Medicine

## 2014-06-08 DIAGNOSIS — H1131 Conjunctival hemorrhage, right eye: Secondary | ICD-10-CM

## 2014-06-08 NOTE — Discharge Instructions (Signed)
Subconjunctival Hemorrhage °A subconjunctival hemorrhage is a bright red patch covering a portion of the white of the eye. The white part of the eye is called the sclera, and it is covered by a thin membrane called the conjunctiva. This membrane is clear, except for tiny blood vessels that you can see with the naked eye. When your eye is irritated or inflamed and becomes red, it is because the vessels in the conjunctiva are swollen. °Sometimes, a blood vessel in the conjunctiva can break and bleed. When this occurs, the blood builds up between the conjunctiva and the sclera, and spreads out to create a red area. The red spot may be very small at first. It may then spread to cover a larger part of the surface of the eye, or even all of the visible white part of the eye. °In almost all cases, the blood will go away and the eye will become white again. Before completely dissolving, however, the red area may spread. It may also become brownish-yellow in color before going away. If a lot of blood collects under the conjunctiva, it may look like a bulge on the surface of the eye. This looks scary, but it will also eventually flatten out and go away. Subconjunctival hemorrhages do not cause pain, but if swollen, may cause a feeling of irritation. There is no effect on vision.  °CAUSES  °· The most common cause is mild trauma (rubbing the eye, irritation). °· Subconjunctival hemorrhages can happen because of coughing or straining (lifting heavy objects), vomiting, or sneezing. °· In some cases, your doctor may want to check your blood pressure. High blood pressure can also cause a subconjunctival hemorrhage. °· Severe trauma or blunt injuries. °· Diseases that affect blood clotting (hemophilia, leukemia). °· Abnormalities of blood vessels behind the eye (carotid cavernous sinus fistula). °· Tumors behind the eye. °· Certain drugs (aspirin, Coumadin, heparin). °· Recent eye surgery. °HOME CARE INSTRUCTIONS  °· Do not worry  about the appearance of your eye. You may continue your usual activities. °· Often, follow-up is not necessary. °SEEK MEDICAL CARE IF:  °· Your eye becomes painful. °· The bleeding does not disappear within 3 weeks. °· Bleeding occurs elsewhere, for example, under the skin, in the mouth, or in the other eye. °· You have recurring subconjunctival hemorrhages. °SEEK IMMEDIATE MEDICAL CARE IF:  °· Your vision changes or you have difficulty seeing. °· You develop a severe headache, persistent vomiting, confusion, or abnormal drowsiness (lethargy). °· Your eye seems to bulge or protrude from the eye socket. °· You notice the sudden appearance of bruises or have spontaneous bleeding elsewhere on your body. °Document Released: 05/11/2005 Document Revised: 09/25/2013 Document Reviewed: 04/08/2009 °ExitCare® Patient Information ©2015 [employer]. This information is not intended to replace advice given to you by your health care provider. Make sure you discuss any questions you have with your health care provider. ° °

## 2014-06-08 NOTE — ED Provider Notes (Signed)
CSN: [id number]     Arrival date & time 06/08/14  1048 History   First MD Initiated Contact with Patient 06/08/14 1129     Chief Complaint  Patient presents with  . Headache   (Consider location/radiation/quality/duration/timing/severity/associated sxs/prior Treatment) HPI Comments: Developed right [facility] yesterday afternoon. This has caused no changes in vision and only occasional aching in right eye with occular movements. No reported injury. States these have occurred in the past and resolved spontaneously. Does endorse some mild right parietal scalp sensitivity yesterday that has since resolved. Does not take anticoagulant medications. Wears contact lenses or glasses for vision correction.   Patient is a 59 y.o. female presenting with eye problem. The history is provided by the patient.  Eye Problem Location:  R eye Quality:  Aching Severity:  Mild Duration:  1 day Timing:  Constant Progression:  Unchanged Chronicity:  Recurrent   Past Medical History  Diagnosis Date  . Hyperlipidemia   . Knee pain   . Allergy   . Fibroids 2009    embolization of fibroids  . Cyst 1976    left arm   . Complication of anesthesia     increased Heart Rate after knee surgery  . Heart murmur   . Bronchitis 3+yrs ago  . Seasonal allergies     takes Allegra as needed as well as using Flonase  . Vertigo   . Arthritis     back  . Chronic back pain     hx of;pt lost weight and does yoga and back pain gone  . Eczema   . GERD (gastroesophageal reflux disease)     takes Omeprazole daily prn  . History of bladder infections 25+yrs ago    used to see a urologist  . History of IBS   . Anemia     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  . Hypothyroidism     hx of 30+yrs ago;was on meds x couple of months and has been fine since  . Anemia    Past Surgical History  Procedure Laterality Date  . Knee surgery  15+yrs ago    left knee  . Tonsillectomy      and adenoids as child  .  Endometrial ablation  2011  . Tubal ligation  1988  . Colonoscopy    . Cholecystectomy  05/08/2011    Procedure: LAPAROSCOPIC CHOLECYSTECTOMY WITH INTRAOPERATIVE CHOLANGIOGRAM;  Surgeon: Harl Bowie, MD;  Location: [place];  Service: General;  Laterality: N/A;  LAPAROSCOPIC CHOLECYSTECTOMY WITH INTRAOPERATIVE CHOLANGIOGRAM  . Cholecystectomy    . Knee surgery     Family History  Problem Relation Age of Onset  . Heart disease Mother   . Anesthesia problems Neg Hx   . Hypotension Neg Hx   . Malignant hyperthermia Neg Hx   . Pseudochol deficiency Neg Hx    History  Substance Use Topics  . Smoking status: Never Smoker   . Smokeless tobacco: Not on file  . Alcohol Use: Yes     Comment: occasional wine   OB History    No data available     Review of Systems  All other systems reviewed and are negative.   Allergies  Anesthetics, amide; Celebrex; and Celecoxib  Home Medications   Prior to Admission medications   Medication Sig Start Date End Date Taking? Authorizing Provider  Ascorbic Acid (VITAMIN C) 100 MG tablet Take 100 mg by mouth daily.      Historical Provider, MD  Calcium-Magnesium-Vitamin D (  CALCIUM MAGNESIUM PO) Take 1 tablet by mouth 2 (two) times a week.    Historical Provider, MD  cholecalciferol (VITAMIN D) 1000 UNITS tablet Take 1,000 Units by mouth daily.      Historical Provider, MD  cyclobenzaprine (FLEXERIL) 10 MG tablet Take 10 mg by mouth 3 (three) times daily as needed for muscle spasms.    Historical Provider, MD  cyclobenzaprine (FLEXERIL) 5 MG tablet Take 1 tablet (5 mg total) by mouth 3 (three) times daily as needed for muscle spasms. 02/08/14   Harden Mo, MD  Cyclobenzaprine HCl (FLEXERIL PO) Take by mouth.    Historical Provider, MD  diazepam (VALIUM) 5 MG tablet Take 1 tablet (5 mg total) by mouth every 6 (six) hours as needed (spasm). 01/02/13   Jasper Riling. Alvino Chapel, MD  diclofenac (VOLTAREN) 75 MG EC tablet Take 1 tablet (75 mg total) by  mouth 2 (two) times daily. 02/08/14   Harden Mo, MD  Fexofenadine HCl (ALLEGRA PO) Take by mouth.    Historical Provider, MD  Fexofenadine-Pseudoephedrine (ALLEGRA-D 24 HOUR PO) Take 1 capsule by mouth daily as needed. FOR CONGESTION     Historical Provider, MD  fluticasone (FLONASE) 50 MCG/ACT nasal spray Place 2 sprays into the nose daily as needed for rhinitis.     Historical Provider, MD  fluticasone (FLONASE) 50 MCG/ACT nasal spray Place into both nostrils daily.    Historical Provider, MD  folic acid (FOLVITE) 1 MG tablet Take 1 mg by mouth daily.      Historical Provider, MD  GLUCOSAMINE HCL PO Take 1 tablet by mouth 3 (three) times a week.    Historical Provider, MD  HYDROcodone-acetaminophen (NORCO/VICODIN) 5-325 MG per tablet 1 to 2 tabs every 4 to 6 hours as needed for pain. 02/08/14   Harden Mo, MD  OXYCODONE HCL PO Take by mouth.    Historical Provider, MD  oxyCODONE-acetaminophen (PERCOCET/ROXICET) 5-325 MG per tablet Take 1-2 tablets by mouth every 6 (six) hours as needed for pain. 01/02/13   Jasper Riling. Pickering, MD   BP 133/72 mmHg  Pulse 80  Temp(Src) 98.6 F (37 C) (Oral)  Resp 18  SpO2 100% Physical Exam  Constitutional: She is oriented to person, place, and time. She appears well-developed and well-nourished. No distress.  HENT:  Head: Normocephalic and atraumatic.  Right Ear: Hearing, tympanic membrane, external ear and ear canal normal.  Left Ear: Hearing, tympanic membrane, external ear and ear canal normal.  Nose: Nose normal.  Mouth/Throat: Oropharynx is clear and moist.  Eyes: EOM and lids are normal. Pupils are equal, round, and reactive to light. Right conjunctiva is not injected. Right conjunctiva has a hemorrhage. Left conjunctiva is not injected. Left conjunctiva has no hemorrhage.  Slit lamp exam:      The left eye shows no hyphema.    Cardiovascular: Normal rate, regular rhythm and normal heart sounds.   Pulmonary/Chest: Effort normal and breath  sounds normal.  Musculoskeletal: Normal range of motion.  Neurological: She is alert and oriented to person, place, and time.  Skin: Skin is warm and dry. No rash noted. No erythema.  Psychiatric: She has a normal mood and affect. Her behavior is normal.  Nursing note and vitals reviewed.   ED Course  Procedures (including critical care time) Labs Review Labs Reviewed - No data to display  Imaging Review No results found.   MDM   1. Subconjunctival hemorrhage, right   SEEK MEDICAL CARE IF:   Your eye  becomes painful.  The bleeding does not disappear within 3 weeks.  Bleeding occurs elsewhere, for example, under the skin, in the mouth, or in the other eye.  You have recurring subconjunctival hemorrhages. SEEK IMMEDIATE MEDICAL CARE IF:   Your vision changes or you have difficulty seeing.  You develop a severe headache, persistent vomiting, confusion, or abnormal drowsiness (lethargy).  Your eye seems to bulge or protrude from the eye socket. You notice the sudden appearance of bruises or have spontaneous bleeding elsewhere on your body.   Lutricia Feil, [place] 06/08/14 [phone]

## 2014-06-08 NOTE — ED Notes (Signed)
States she had a blood vessel burst in her right eye yesterday. Subconjunctival bleed noted. Denies eye pain at present. States she also has pain in the right side of her head

## 2015-01-09 DIAGNOSIS — R17 Unspecified jaundice: Secondary | ICD-10-CM | POA: Insufficient documentation

## 2015-01-09 DIAGNOSIS — M25512 Pain in left shoulder: Secondary | ICD-10-CM | POA: Insufficient documentation

## 2015-03-14 ENCOUNTER — Other Ambulatory Visit (INDEPENDENT_AMBULATORY_CARE_PROVIDER_SITE_OTHER): Payer: BLUE CROSS/BLUE SHIELD | Admitting: *Deleted

## 2015-03-14 ENCOUNTER — Ambulatory Visit (INDEPENDENT_AMBULATORY_CARE_PROVIDER_SITE_OTHER): Payer: BLUE CROSS/BLUE SHIELD | Admitting: Internal Medicine

## 2015-03-14 ENCOUNTER — Encounter: Payer: Self-pay | Admitting: Internal Medicine

## 2015-03-14 VITALS — BP 112/68 | HR 85 | Temp 97.6°F | Resp 12 | Ht 74.0 in | Wt 213.0 lb

## 2015-03-14 DIAGNOSIS — R7301 Impaired fasting glucose: Secondary | ICD-10-CM

## 2015-03-14 DIAGNOSIS — Z23 Encounter for immunization: Secondary | ICD-10-CM | POA: Diagnosis not present

## 2015-03-14 NOTE — Progress Notes (Signed)
Patient ID: Carrie [name], female   DOB: [birth year], 59 y.o.   MRN: [id number]  HPI: Carrie [name] is a 59 y.o.-year-old female, referred by her PCP, Dr. Drema Dallas, for management of elevated fasting glucose levels, since 2013.  She had elevated fasting sugars in the last 3 years. 01/11/2015: Glu 144 06/16/2014: Glu 109  However, hemoglobin A1c was low x2 - pt with spherocytic anemia: 01/11/2015: HbA1c <4% 06/16/2014: HbA1c 4.4%  Pt does not check her sugars at home. - am: n/c - 2h after b'fast: n/c - before lunch: n/c - 2h after lunch: n/c - before dinner: n/c - 2h after dinner: n/c - bedtime: n/c - nighttime: n/c  Glucometer: n/a  Pt's meals are: - Breakfast: smoothie - fruit, kale, spinach - Lunch: salad, chicken, veggies - Dinner: chicken and fish, veg - Snacks: chocolate - candy, icecream No sodas, juice.  - no CKD, last BUN/creatinine:  12/05/2014: BUN/Cr 15/0.76 Lab Results  Component Value Date   BUN 14 06/14/2012   CREATININE 0.66 06/14/2012   - last set of lipids: 12/05/2014: 126/69/42/70 - last eye exam was in 2015.  - no numbness and tingling in her feet.  Pt has FH of DM in M, F, sister.  Has a frozen shoulder, GERD.  Reviewed recent TSH >> normal.  ROS: Constitutional: + weight gain, + fatigue, no subjective hyperthermia/hypothermia, + nocturia Eyes: no blurry vision, no xerophthalmia ENT: no sore throat, no nodules palpated in throat, no dysphagia/odynophagia, no hoarseness Cardiovascular: no CP/SOB/palpitations/leg swelling Respiratory: no cough/SOB Gastrointestinal: no N/V/D/C/+ acid reflux Musculoskeletal: no muscle/+ joint aches Skin: no rashes Neurological: no tremors/numbness/tingling/dizziness Psychiatric: no depression/anxiety  Past Medical History  Diagnosis Date  . Hyperlipidemia   . Knee pain   . Allergy   . Fibroids 2009    embolization of fibroids  . Cyst 1976    left arm   . Complication of anesthesia    increased Heart Rate after knee surgery  . Heart murmur   . Bronchitis 3+yrs ago  . Seasonal allergies     takes Allegra as needed as well as using Flonase  . Vertigo   . Arthritis     back  . Chronic back pain     hx of;pt lost weight and does yoga and back pain gone  . Eczema   . GERD (gastroesophageal reflux disease)     takes Omeprazole daily prn  . History of bladder infections 25+yrs ago    used to see a urologist  . History of IBS   . Anemia     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  . Hypothyroidism     hx of 30+yrs ago;was on meds x couple of months and has been fine since  . Anemia    Past Surgical History  Procedure Laterality Date  . Knee surgery  15+yrs ago    left knee  . Tonsillectomy      and adenoids as child  . Endometrial ablation  2011  . Tubal ligation  1988  . Colonoscopy    . Cholecystectomy  05/08/2011    Procedure: LAPAROSCOPIC CHOLECYSTECTOMY WITH INTRAOPERATIVE CHOLANGIOGRAM;  Surgeon: Harl Bowie, MD;  Location: [facility];  Service: General;  Laterality: N/A;  LAPAROSCOPIC CHOLECYSTECTOMY WITH INTRAOPERATIVE CHOLANGIOGRAM  . Cholecystectomy    . Knee surgery     Social History   Social History  . Marital Status: Single    Spouse Name: N/A  . Number of Children: 2  Occupational History  . teacher   Social History Main Topics  . Smoking status: Never Smoker   . Smokeless tobacco: Not on file  . Alcohol Use: Yes     Comment: occasional wine - 1 glass per week  . Drug Use: No   Current Outpatient Prescriptions on File Prior to Visit  Medication Sig Dispense Refill  . Ascorbic Acid (VITAMIN C) 100 MG tablet Take 100 mg by mouth daily.      . Calcium-Magnesium-Vitamin D (CALCIUM MAGNESIUM PO) Take 1 tablet by mouth 2 (two) times a week.    . cholecalciferol (VITAMIN D) 1000 UNITS tablet Take 1,000 Units by mouth daily.      Marland Kitchen Fexofenadine HCl (ALLEGRA PO) Take by mouth.    . Fexofenadine-Pseudoephedrine (ALLEGRA-D 24  HOUR PO) Take 1 capsule by mouth daily as needed. FOR CONGESTION     . fluticasone (FLONASE) 50 MCG/ACT nasal spray Place 2 sprays into the nose daily as needed for rhinitis.     . folic acid (FOLVITE) 1 MG tablet Take 1 mg by mouth daily.      Marland Kitchen GLUCOSAMINE HCL PO Take 1 tablet by mouth 3 (three) times a week.    . cyclobenzaprine (FLEXERIL) 10 MG tablet Take 10 mg by mouth 3 (three) times daily as needed for muscle spasms.    . diazepam (VALIUM) 5 MG tablet Take 1 tablet (5 mg total) by mouth every 6 (six) hours as needed (spasm). (Patient not taking: Reported on 03/14/2015) 10 tablet 0   No current facility-administered medications on file prior to visit.   Allergies  Allergen Reactions  . Anesthetics, Amide     INCREASED HEART RATE  . Celebrex [Celecoxib]   . Celecoxib     SEVERE CHEST PAIN   Family History  Problem Relation Age of Onset  . Heart disease Mother   . Anesthesia problems Neg Hx   . Hypotension Neg Hx   . Malignant hyperthermia Neg Hx   . Pseudochol deficiency Neg Hx    PE: BP 112/68 mmHg  Pulse 85  Temp(Src) 97.6 F (36.4 C) (Oral)  Resp 12  Ht 6\' 2"  (1.88 m)  Wt 213 lb (96.616 kg)  BMI 27.34 kg/m2  SpO2 97% Wt Readings from Last 3 Encounters:  03/14/15 213 lb (96.616 kg)  06/02/11 210 lb (95.255 kg)  05/01/11 207 lb 3.7 oz (94 kg)   Constitutional: overweight, in NAD Eyes: PERRLA, EOMI, no exophthalmos ENT: moist mucous membranes, + symmetric thyromegaly, no nodules palpated, no cervical lymphadenopathy Cardiovascular: RRR, No MRG Respiratory: CTA B Gastrointestinal: abdomen soft, NT, ND, BS+ Musculoskeletal: no deformities, strength intact in all 4 Skin: moist, warm, no rashes Neurological: no tremor with outstretched hands, DTR normal in all 4  ASSESSMENT: 1. Elevated Fasting Glucose  PLAN:  1. Patient with elevated fasting sugars over the last 3 years, with repeatedly low HbA1c, in the presence of spherocytosis. I explained that she likely  has prediabetes (Impaired Fasting Glucose), but HbA1c is not an accurate measure for her due to her hematologic ds. I suggested that she starts checking sugars every other day. We will also check a fructosamine, but we will need to take the results with a grain of salt as she just had a shoulder steroid injection and a Prednisone taper. - I suggested to:  Patient Instructions  Please start checking your sugars 1x every other day as advised. Please call me with your sugars in 2-3 weeks.  Please stop at the  lab.  Please come back for a follow-up appointment in 3 months.  Please try to join MyChart for easier communication.  - given a One Touch Verio Flex glucometer and demonstrated use. - given sugar log and advised how to fill it and to bring it at next appt  - given foot care handout and explained the principles  - given instructions for hypoglycemia management "15-15 rule"  - advised for yearly eye exams >> needs one - given flu shot today - Return to clinic in 3 mo with sugar log   Office Visit on 03/14/2015  Component Date Value Ref Range Status  . Fructosamine 03/14/2015 311* 190 - 270 umol/L Final  Hemoglobin A1c calculated back from fructosamine is 6.89%.   This is in the diabetic range and is more in line with the previous fasting blood sugars. As fructosamine reflects the average blood sugar over the last 3 weeks, this is mostly impressed by her steroid injection. I asked the patient to start checking her sugars at home and call me back with the results in 2-3 weeks. At that time, we'll decide whether we need to start metformin, but I believe we can wait at least until next visit, when we will need to repeat her fructosamine check.

## 2015-03-14 NOTE — Patient Instructions (Signed)
Please start checking your sugars 1x every other day as advised. Please call me with your sugars in 2-3 weeks.  Please stop at the lab.  Please come back for a follow-up appointment in 3 months.  Please try to join MyChart for easier communication.

## 2015-03-18 LAB — FRUCTOSAMINE: FRUCTOSAMINE: 311 umol/L — AB (ref 190–270)

## 2015-04-09 LAB — HM DIABETES EYE EXAM

## 2015-05-01 ENCOUNTER — Encounter: Payer: Self-pay | Admitting: Internal Medicine

## 2015-05-08 LAB — HM DIABETES EYE EXAM

## 2015-06-13 ENCOUNTER — Ambulatory Visit: Payer: BLUE CROSS/BLUE SHIELD | Admitting: Internal Medicine

## 2015-06-20 ENCOUNTER — Encounter: Payer: Self-pay | Admitting: Internal Medicine

## 2015-09-24 DIAGNOSIS — S90111A Contusion of right great toe without damage to nail, initial encounter: Secondary | ICD-10-CM | POA: Diagnosis not present

## 2015-10-28 DIAGNOSIS — S9032XA Contusion of left foot, initial encounter: Secondary | ICD-10-CM | POA: Diagnosis not present

## 2015-10-28 DIAGNOSIS — M7501 Adhesive capsulitis of right shoulder: Secondary | ICD-10-CM | POA: Diagnosis not present

## 2015-10-28 DIAGNOSIS — M25512 Pain in left shoulder: Secondary | ICD-10-CM | POA: Diagnosis not present

## 2015-11-06 DIAGNOSIS — R102 Pelvic and perineal pain: Secondary | ICD-10-CM | POA: Diagnosis not present

## 2015-11-19 DIAGNOSIS — D250 Submucous leiomyoma of uterus: Secondary | ICD-10-CM | POA: Diagnosis not present

## 2015-11-19 DIAGNOSIS — R938 Abnormal findings on diagnostic imaging of other specified body structures: Secondary | ICD-10-CM | POA: Diagnosis not present

## 2015-12-27 DIAGNOSIS — Z01419 Encounter for gynecological examination (general) (routine) without abnormal findings: Secondary | ICD-10-CM | POA: Diagnosis not present

## 2015-12-27 DIAGNOSIS — Z6826 Body mass index (BMI) 26.0-26.9, adult: Secondary | ICD-10-CM | POA: Diagnosis not present

## 2015-12-27 DIAGNOSIS — R87610 Atypical squamous cells of undetermined significance on cytologic smear of cervix (ASC-US): Secondary | ICD-10-CM | POA: Diagnosis not present

## 2015-12-27 DIAGNOSIS — Z1231 Encounter for screening mammogram for malignant neoplasm of breast: Secondary | ICD-10-CM | POA: Diagnosis not present

## 2015-12-27 DIAGNOSIS — Z1151 Encounter for screening for human papillomavirus (HPV): Secondary | ICD-10-CM | POA: Diagnosis not present

## 2016-01-21 ENCOUNTER — Other Ambulatory Visit: Payer: Self-pay | Admitting: Obstetrics and Gynecology

## 2016-02-07 NOTE — Patient Instructions (Signed)
Your procedure is scheduled on:  Tuesday, Sept. 26, 2017  Enter through the [employer] of [facility] at:  12 noon  Pick up the phone at the desk and dial [phone].  Call this number if you have problems the morning of surgery: [phone].  Remember: Do NOT eat food:  After Midnight Monday, Sept. 25, 2017  Do NOT drink clear liquids after:  9:30 AM day of surgery  Take these medicines the morning of surgery with a SIP OF WATER:  None  Stop taking Vitamin C, Glucosamine, and Turmeric at this time  Do NOT wear jewelry (body piercing), metal hair clips/bobby pins, make-up, or nail polish. Do NOT wear lotions, powders, or perfumes.  You may wear deodorant. Do NOT shave for 48 hours prior to surgery. Do NOT bring valuables to the hospital. Contacts, dentures, or bridgework may not be worn into surgery.  Have a responsible adult drive you home and stay with you for 24 hours after your procedure

## 2016-02-10 ENCOUNTER — Other Ambulatory Visit: Payer: Self-pay

## 2016-02-10 ENCOUNTER — Encounter (HOSPITAL_COMMUNITY): Payer: Self-pay

## 2016-02-10 ENCOUNTER — Encounter (HOSPITAL_COMMUNITY)
Admission: RE | Admit: 2016-02-10 | Discharge: 2016-02-10 | Disposition: A | Discharge status: Home / Self Care | Payer: BLUE CROSS/BLUE SHIELD | Source: Ambulatory Visit | Attending: Obstetrics and Gynecology | Admitting: Obstetrics and Gynecology

## 2016-02-10 DIAGNOSIS — Z01818 Encounter for other preprocedural examination: Secondary | ICD-10-CM | POA: Diagnosis not present

## 2016-02-10 HISTORY — DX: Prediabetes: R73.03

## 2016-02-10 HISTORY — DX: Headache, unspecified: R51.9

## 2016-02-10 HISTORY — DX: Abscess of the breast and nipple: N61.1

## 2016-02-10 HISTORY — DX: Headache: R51

## 2016-02-10 HISTORY — DX: Unspecified jaundice: R17

## 2016-02-10 LAB — CBC
HCT: 30.8 % — ABNORMAL LOW (ref 36.0–46.0)
HEMOGLOBIN: 10.9 g/dL — AB (ref 12.0–15.0)
MCH: 26.3 pg (ref 26.0–34.0)
MCHC: 35.4 g/dL (ref 30.0–36.0)
MCV: 74.2 fL — AB (ref 78.0–100.0)
Platelets: 216 10*3/uL (ref 150–400)
RBC: 4.15 MIL/uL (ref 3.87–5.11)
RDW: 18.4 % — ABNORMAL HIGH (ref 11.5–15.5)
WBC: 3.3 10*3/uL — ABNORMAL LOW (ref 4.0–10.5)

## 2016-02-18 ENCOUNTER — Ambulatory Visit (HOSPITAL_COMMUNITY): Payer: BLUE CROSS/BLUE SHIELD | Admitting: Anesthesiology

## 2016-02-18 ENCOUNTER — Encounter (HOSPITAL_COMMUNITY): Payer: Self-pay | Admitting: Anesthesiology

## 2016-02-18 ENCOUNTER — Encounter (HOSPITAL_COMMUNITY)
Admission: RE | Disposition: A | Discharge status: Home / Self Care | Payer: Self-pay | Source: Ambulatory Visit | Attending: Obstetrics and Gynecology

## 2016-02-18 ENCOUNTER — Ambulatory Visit (HOSPITAL_COMMUNITY)
Admission: RE | Admit: 2016-02-18 | Discharge: 2016-02-18 | Disposition: A | Discharge status: Home / Self Care | Payer: BLUE CROSS/BLUE SHIELD | Source: Ambulatory Visit | Attending: Obstetrics and Gynecology | Admitting: Obstetrics and Gynecology

## 2016-02-18 DIAGNOSIS — D250 Submucous leiomyoma of uterus: Secondary | ICD-10-CM | POA: Diagnosis not present

## 2016-02-18 DIAGNOSIS — R938 Abnormal findings on diagnostic imaging of other specified body structures: Secondary | ICD-10-CM | POA: Diagnosis not present

## 2016-02-18 DIAGNOSIS — K219 Gastro-esophageal reflux disease without esophagitis: Secondary | ICD-10-CM | POA: Diagnosis not present

## 2016-02-18 DIAGNOSIS — N840 Polyp of corpus uteri: Secondary | ICD-10-CM | POA: Diagnosis not present

## 2016-02-18 DIAGNOSIS — R9389 Abnormal findings on diagnostic imaging of other specified body structures: Secondary | ICD-10-CM

## 2016-02-18 DIAGNOSIS — N858 Other specified noninflammatory disorders of uterus: Secondary | ICD-10-CM | POA: Diagnosis not present

## 2016-02-18 HISTORY — PX: DILATATION & CURETTAGE/HYSTEROSCOPY WITH MYOSURE: SHX6511

## 2016-02-18 SURGERY — DILATATION & CURETTAGE/HYSTEROSCOPY WITH MYOSURE
Anesthesia: General | Site: Vagina

## 2016-02-18 MED ORDER — KETOROLAC TROMETHAMINE 30 MG/ML IJ SOLN
INTRAMUSCULAR | Status: AC
  Filled 2016-02-18: qty 1

## 2016-02-18 MED ORDER — MIDAZOLAM HCL 2 MG/2ML IJ SOLN
INTRAMUSCULAR | Status: AC
  Filled 2016-02-18: qty 2

## 2016-02-18 MED ORDER — GLYCOPYRROLATE 0.2 MG/ML IJ SOLN
INTRAMUSCULAR | Status: AC
  Filled 2016-02-18: qty 1

## 2016-02-18 MED ORDER — FENTANYL CITRATE (PF) 100 MCG/2ML IJ SOLN
INTRAMUSCULAR | Status: AC
  Filled 2016-02-18: qty 2

## 2016-02-18 MED ORDER — OXYCODONE HCL 5 MG/5ML PO SOLN
5.0000 mg | Freq: Once | ORAL | Status: DC | PRN
  Filled 2016-02-18: qty 5

## 2016-02-18 MED ORDER — MIDAZOLAM HCL 2 MG/2ML IJ SOLN
INTRAMUSCULAR | Status: DC | PRN
  Administered 2016-02-18: 1 mg via INTRAVENOUS

## 2016-02-18 MED ORDER — LACTATED RINGERS IV SOLN
INTRAVENOUS | Status: DC
  Administered 2016-02-18: 125 mL/h via INTRAVENOUS

## 2016-02-18 MED ORDER — ONDANSETRON HCL 4 MG/2ML IJ SOLN
INTRAMUSCULAR | Status: AC
  Filled 2016-02-18: qty 2

## 2016-02-18 MED ORDER — SODIUM CHLORIDE 0.9 % IR SOLN
Status: DC | PRN
  Administered 2016-02-18 (×2): 3000 mL

## 2016-02-18 MED ORDER — ONDANSETRON HCL 4 MG/2ML IJ SOLN
INTRAMUSCULAR | Status: DC | PRN
  Administered 2016-02-18: 4 mg via INTRAVENOUS

## 2016-02-18 MED ORDER — KETOROLAC TROMETHAMINE 30 MG/ML IJ SOLN
INTRAMUSCULAR | Status: DC | PRN
  Administered 2016-02-18: 30 mg via INTRAVENOUS

## 2016-02-18 MED ORDER — FLUMAZENIL 0.5 MG/5ML IV SOLN
INTRAVENOUS | Status: DC | PRN
  Administered 2016-02-18: 0.2 mg via INTRAVENOUS

## 2016-02-18 MED ORDER — OXYCODONE HCL 5 MG PO TABS: 5.0000 mg | ORAL_TABLET | ORAL | 0 refills | Status: DC | PRN

## 2016-02-18 MED ORDER — OXYCODONE HCL 5 MG PO TABS: 5.0000 mg | ORAL_TABLET | Freq: Once | ORAL | Status: DC | PRN

## 2016-02-18 MED ORDER — FENTANYL CITRATE (PF) 100 MCG/2ML IJ SOLN
INTRAMUSCULAR | Status: DC | PRN
  Administered 2016-02-18 (×2): 50 ug via INTRAVENOUS

## 2016-02-18 MED ORDER — HYDROMORPHONE HCL 1 MG/ML IJ SOLN
0.2500 mg | INTRAMUSCULAR | Status: DC | PRN
  Administered 2016-02-18: 0.5 mg via INTRAVENOUS

## 2016-02-18 MED ORDER — MEPERIDINE HCL 25 MG/ML IJ SOLN: 6.2500 mg | INTRAMUSCULAR | Status: DC | PRN

## 2016-02-18 MED ORDER — DEXAMETHASONE SODIUM PHOSPHATE 10 MG/ML IJ SOLN
INTRAMUSCULAR | Status: DC | PRN
Start: 2016-02-18 — End: 2016-02-20
  Administered 2016-02-18: 4 mg via INTRAVENOUS

## 2016-02-18 MED ORDER — PROMETHAZINE HCL 25 MG/ML IJ SOLN: 6.2500 mg | INTRAMUSCULAR | Status: DC | PRN

## 2016-02-18 MED ORDER — LIDOCAINE HCL (CARDIAC) 20 MG/ML IV SOLN
INTRAVENOUS | Status: AC
  Filled 2016-02-18: qty 5

## 2016-02-18 MED ORDER — GLYCOPYRROLATE 0.2 MG/ML IJ SOLN
INTRAMUSCULAR | Status: DC | PRN
  Administered 2016-02-18: 0.2 mg via INTRAVENOUS

## 2016-02-18 MED ORDER — PROPOFOL 10 MG/ML IV BOLUS
INTRAVENOUS | Status: DC | PRN
  Administered 2016-02-18: 170 mg via INTRAVENOUS

## 2016-02-18 MED ORDER — DEXAMETHASONE SODIUM PHOSPHATE 4 MG/ML IJ SOLN
INTRAMUSCULAR | Status: AC
  Filled 2016-02-18: qty 1

## 2016-02-18 MED ORDER — HYDROMORPHONE HCL 1 MG/ML IJ SOLN
INTRAMUSCULAR | Status: AC
  Filled 2016-02-18: qty 1

## 2016-02-18 MED ORDER — PROPOFOL 10 MG/ML IV BOLUS
INTRAVENOUS | Status: AC
  Filled 2016-02-18: qty 20

## 2016-02-18 MED ORDER — LIDOCAINE HCL (CARDIAC) 20 MG/ML IV SOLN
INTRAVENOUS | Status: DC | PRN
  Administered 2016-02-18: 30 mg via INTRAVENOUS
  Administered 2016-02-18: 70 mg via INTRAVENOUS

## 2016-02-18 SURGICAL SUPPLY — 20 items
CANISTER SUCT 3000ML (MISCELLANEOUS) ×2 IMPLANT
CATH ROBINSON RED A/P 16FR (CATHETERS) ×2 IMPLANT
CLOTH BEACON ORANGE TIMEOUT ST (SAFETY) ×2 IMPLANT
CONTAINER PREFILL 10% NBF 60ML (FORM) ×4 IMPLANT
DEVICE MYOSURE LITE (MISCELLANEOUS) IMPLANT
DEVICE MYOSURE REACH (MISCELLANEOUS) ×2 IMPLANT
ELECT REM PT RETURN 9FT ADLT (ELECTROSURGICAL)
ELECTRODE REM PT RTRN 9FT ADLT (ELECTROSURGICAL) IMPLANT
FILTER ARTHROSCOPY CONVERTOR (FILTER) ×2 IMPLANT
GLOVE BIOGEL PI IND STRL 7.0 (GLOVE) ×2 IMPLANT
GLOVE BIOGEL PI INDICATOR 7.0 (GLOVE) ×2
GLOVE ECLIPSE 6.5 STRL STRAW (GLOVE) ×2 IMPLANT
GOWN STRL REUS W/TWL LRG LVL3 (GOWN DISPOSABLE) ×4 IMPLANT
PACK VAGINAL MINOR WOMEN LF (CUSTOM PROCEDURE TRAY) ×2 IMPLANT
PAD OB MATERNITY 4.3X12.25 (PERSONAL CARE ITEMS) ×2 IMPLANT
SEAL ROD LENS SCOPE MYOSURE (ABLATOR) ×2 IMPLANT
TOWEL OR 17X24 6PK STRL BLUE (TOWEL DISPOSABLE) ×4 IMPLANT
TUBING AQUILEX INFLOW (TUBING) ×2 IMPLANT
TUBING AQUILEX OUTFLOW (TUBING) ×2 IMPLANT
WATER STERILE IRR 1000ML POUR (IV SOLUTION) ×2 IMPLANT

## 2016-02-18 NOTE — Anesthesia Procedure Notes (Signed)
Procedure Name: LMA Insertion Performed by: Tobin Chad Pre-anesthesia Checklist: Patient identified, Emergency Drugs available, Suction available, Patient being monitored and Timeout performed Patient Re-evaluated:Patient Re-evaluated prior to inductionOxygen Delivery Method: Simple face mask Preoxygenation: Pre-oxygenation with 100% oxygen Intubation Type: IV induction Ventilation: Mask ventilation without difficulty LMA Size: 4.0 Grade View: Grade II Number of attempts: 1 Placement Confirmation: positive ETCO2 and breath sounds checked- equal and bilateral Dental Injury: Teeth and Oropharynx as per pre-operative assessment

## 2016-02-18 NOTE — H&P (Signed)
Carrie [name] is an 60 y.o. female.[id number] married female presents for hysteroscopic resection of endom mass noted on sonohysterogram done for endometrial thickening  Pertinent Gynecological History: Menses: post-menopausal Bleeding: none Contraception: none DES exposure: denies Blood transfusions: none Sexually transmitted diseases: no past history Previous GYN Procedures: DNC ,  Hysteroscopy with polyp & SM fibroid removal ,, endom ablation,UAE, TL Last mammogram: normal Date: 2017 Last pap: normal Date: 12/2015 OB History: G3, P2012   Menstrual History: Menarche age:n/a No LMP recorded. Patient is postmenopausal.    Past Medical History:  Diagnosis Date  . Allergy   . Anemia    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  . Anemia   . Arthritis    back  . Boil, breast   . Bronchitis 3+yrs ago  . Chronic back pain    hx of;pt lost weight and does yoga and back pain gone  . Complication of anesthesia    increased Heart Rate after knee surgery  . Cyst 1976   left arm   . Eczema   . 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 Fibroids 2009   embolization of fibroids  . GERD (gastroesophageal reflux disease)    takes Omeprazole daily prn, history of  . Headache   . Heart murmur   . History of bladder infections 25+yrs ago   used to see a urologist  . History of IBS   . Hyperlipidemia   . Hypothyroidism    hx of 30+yrs ago;was on meds x couple of months and has been fine since  . Knee pain   . Pre-diabetes   . Seasonal allergies    takes Allegra as needed as well as using Flonase  . Vertigo     Past Surgical History:  Procedure Laterality Date  . CHOLECYSTECTOMY  05/08/2011   Procedure: LAPAROSCOPIC CHOLECYSTECTOMY WITH INTRAOPERATIVE CHOLANGIOGRAM;  Surgeon: Harl Bowie, MD;  Location: [place];  Service: General;  Laterality: N/A;  LAPAROSCOPIC CHOLECYSTECTOMY WITH INTRAOPERATIVE CHOLANGIOGRAM  . CHOLECYSTECTOMY    .  COLONOSCOPY    . ENDOMETRIAL ABLATION  2011  . KNEE SURGERY  15+yrs ago   left knee  . KNEE SURGERY    . TONSILLECTOMY     and adenoids as child  . TUBAL LIGATION  1988    Family History  Problem Relation Age of Onset  . Heart disease Mother   . Anesthesia problems Neg Hx   . Hypotension Neg Hx   . Malignant hyperthermia Neg Hx   . Pseudochol deficiency Neg Hx     Social History:  reports that she has never smoked. She has never used smokeless tobacco. She reports that she drinks alcohol. She reports that she does not use drugs.  Allergies:  Allergies  Allergen Reactions  . Anesthetics, Amide     INCREASED HEART RATE  . Celebrex [Celecoxib]   . Celecoxib     SEVERE CHEST PAIN    Prescriptions Prior to Admission  Medication Sig Dispense Refill Last Dose  . Ascorbic Acid (VITAMIN C) 100 MG tablet Take 100 mg by mouth daily.     Taking  . Calcium-Magnesium-Vitamin D (CALCIUM MAGNESIUM PO) Take 1 tablet by mouth 2 (two) times a week.   Taking  . cholecalciferol (VITAMIN D) 1000 UNITS tablet Take 1,000 Units by mouth daily.     Taking  . Fexofenadine-Pseudoephedrine (ALLEGRA-D 24 HOUR PO) Take 1 capsule by mouth daily as needed. FOR CONGESTION  Taking  . fluticasone (FLONASE) 50 MCG/ACT nasal spray Place 2 sprays into the nose daily as needed for rhinitis.    Taking  . FOLIC ACID PO Take 1 tablet by mouth daily.     Marland Kitchen GLUCOSAMINE HCL PO Take 1 tablet by mouth 3 (three) times a week.   Taking  . Multiple Vitamin (MULTIVITAMIN) tablet Take 1 tablet by mouth daily.   Taking  . Probiotic Product (PROBIOTIC ADVANCED PO) Take 1 capsule by mouth 3 (three) times a week.    Taking  . Turmeric POWD by Does not apply route. 1 teaspoon 3 times weekly   Taking    Review of Systems  All other systems reviewed and are negative.   There were no vitals taken for this visit. Physical Exam  Constitutional: She is oriented to person, place, and time. She appears well-developed and  well-nourished.  Eyes: EOM are normal.  Neck: Neck supple.  Cardiovascular: Regular rhythm.   Respiratory: Breath sounds normal.  GI: Soft.  Genitourinary: Vagina normal.  Musculoskeletal: She exhibits no edema.  Neurological: She is alert and oriented to person, place, and time.  Skin: Skin is warm and dry.  Psychiatric: She has a normal mood and affect.  cervix parous' Uterus nodular sl enlarge RV Adnexa. Non palp No results found for this or any previous visit (from the past 24 hour(s)).  No results found.  Assessment/Plan: IMP: endometrial mass Endometrial thickening on sonogram P) dx hysteroscopy, D&C, hysteroscopic resection of endometrial mass. Risk of surgery reviewed including Infection, bleeding, fluid overload and its mgmt, uterine perforation and its risk, thermal injury. All ? answered  Arleatha Philipps A 02/18/2016, 12:02 PM

## 2016-02-18 NOTE — Transfer of Care (Signed)
Immediate Anesthesia Transfer of Care Note  Patient: Carrie [name]  Procedure(s) Performed: Procedure(s): DILATATION & CURETTAGE/HYSTEROSCOPY WITH MYOSURE (N/A)  Patient Location: PACU  Anesthesia Type:General  Level of Consciousness: awake, alert , oriented and patient cooperative  Airway & Oxygen Therapy: Patient Spontanous Breathing and Patient connected to nasal cannula oxygen  Post-op Assessment: Report given to RN and Post -op Vital signs reviewed and stable  Post vital signs: Reviewed and stable  Last Vitals:  Vitals:   02/18/16 1219  BP: 129/71  Pulse: 62  Resp: 14  Temp: 36.7 C    Last Pain:  Vitals:   02/18/16 1219  TempSrc: Oral  PainSc: 7       Patients Stated Pain Goal: 3 ([id number])  Complications: No apparent anesthesia complications

## 2016-02-18 NOTE — Discharge Instructions (Signed)
DISCHARGE INSTRUCTIONS: D&C / D&E °The following instructions have been prepared to help you care for yourself upon your return home. °  °Personal hygiene: °• Use sanitary pads for vaginal drainage, not tampons. °• Shower the day after your procedure. °• NO tub baths, pools or Jacuzzis for 2-3 weeks. °• Wipe front to back after using the bathroom. ° °Activity and limitations: °• Do NOT drive or operate any equipment for 24 hours. The effects of anesthesia are still present and drowsiness may result. °• Do NOT rest in bed all day. °• Walking is encouraged. °• Walk up and down stairs slowly. °• You may resume your normal activity in one to two days or as indicated by your physician. ° °Sexual activity: NO intercourse for at least 2 weeks after the procedure, or as indicated by your physician. ° °Diet: Eat a light meal as desired this evening. You may resume your usual diet tomorrow. ° °Return to work: You may resume your work activities in one to two days or as indicated by your doctor. ° °What to expect after your surgery: Expect to have vaginal bleeding/discharge for 2-3 days and spotting for up to 10 days. It is not unusual to have soreness for up to 1-2 weeks. You may have a slight burning sensation when you urinate for the first day. Mild cramps may continue for a couple of days. You may have a regular period in 2-6 weeks. ° °Call your doctor for any of the following: °• Excessive vaginal bleeding, saturating and changing one pad every hour. °• Inability to urinate 6 hours after discharge from hospital. °• Pain not relieved by pain medication. °• Fever of 100.4° F or greater. °• Unusual vaginal discharge or odor. ° ° Call for an appointment:  ° ° °Patient’s signature: ______________________ ° °Nurse’s signature ________________________ ° °Support person's signature_______________________ ° ° °CALL  IF TEMP>100.4, NOTHING PER VAGINA X 1 WK, CALL IF SOAKING A MAXI  PAD EVERY HOUR OR MORE FREQUENTLY °

## 2016-02-18 NOTE — Anesthesia Preprocedure Evaluation (Signed)
Anesthesia Evaluation  Patient identified by MRN, date of birth, ID band Patient awake    Reviewed: Allergy & Precautions, H&P , NPO status , Patient's Chart, lab work & pertinent test results  History of Anesthesia Complications (+) history of anesthetic complications  Airway Mallampati: I  TM Distance: >3 FB Neck ROM: full    Dental  (+) Teeth Intact, Dental Advisory Given   Pulmonary neg pulmonary ROS,    Pulmonary exam normal        Cardiovascular Exercise Tolerance: Good + Valvular Problems/Murmurs  Rhythm:regular Rate:Normal     Neuro/Psych  Headaches, negative psych ROS   GI/Hepatic Neg liver ROS, GERD  ,  Endo/Other  Hypothyroidism   Renal/GU negative Renal ROS     Musculoskeletal  (+) Arthritis ,   Abdominal Normal abdominal exam  (+)   Peds  Hematology  (+) anemia ,   Anesthesia Other Findings   Reproductive/Obstetrics                             Anesthesia Physical  Anesthesia Plan  ASA: II  Anesthesia Plan: General   Post-op Pain Management:    Induction: Intravenous  Airway Management Planned: LMA  Additional Equipment:   Intra-op Plan:   Post-operative Plan: Extubation in OR  Informed Consent: I have reviewed the patients History and Physical, chart, labs and discussed the procedure including the risks, benefits and alternatives for the proposed anesthesia with the patient or authorized representative who has indicated his/her understanding and acceptance.   Dental advisory given  Plan Discussed with: CRNA  Anesthesia Plan Comments:        Anesthesia Quick Evaluation

## 2016-02-18 NOTE — Brief Op Note (Signed)
02/18/2016  2:21 PM  PATIENT:  Carrie [name]  60 y.o. female  PRE-OPERATIVE DIAGNOSIS:  Endometrial Mass, Endometrial Thickening on Sonogram  POST-OPERATIVE DIAGNOSIS:  Endometrial polyps, SM fibroid  PROCEDURE:  Diagnostic hysteroscopy, hysteroscopic resection of SM fibroids, endometrial polyps using myosure, dilation and curettage  SURGEON:  Surgeon(s) and Role:    * Servando Salina, MD - Primary  PHYSICIAN ASSISTANT:   ASSISTANTS: none   ANESTHESIA:   general FINDINGS; endometrial polyps. Ant SM fibroid with calcification. SM fibroid had midline adhesion to post wall, tubal ostia seen, endocervical canal nl EBL:  Total I/O In: 800 [I.V.:800] Out: 50 [Urine:50]  BLOOD ADMINISTERED:none  DRAINS: none   LOCAL MEDICATIONS USED:  NONE  SPECIMEN:  Source of Specimen:  EMC with endom polyp, fibroid resection  DISPOSITION OF SPECIMEN:  PATHOLOGY  COUNTS:  YES  TOURNIQUET:  * No tourniquets in log *  DICTATION: .Other Dictation: Dictation Number [id number]  PLAN OF CARE: Discharge to home after PACU  PATIENT DISPOSITION:  PACU - hemodynamically stable.   Delay start of Pharmacological VTE agent (>24hrs) due to surgical blood loss or risk of bleeding: no

## 2016-02-19 DIAGNOSIS — N840 Polyp of corpus uteri: Secondary | ICD-10-CM | POA: Diagnosis not present

## 2016-02-19 DIAGNOSIS — K219 Gastro-esophageal reflux disease without esophagitis: Secondary | ICD-10-CM | POA: Diagnosis not present

## 2016-02-19 DIAGNOSIS — D250 Submucous leiomyoma of uterus: Secondary | ICD-10-CM | POA: Diagnosis not present

## 2016-02-19 NOTE — Op Note (Signed)
NAME:  Carrie [name], Carrie [name] NO.:  [id number]  MEDICAL RECORD NO.:  [id number]  LOCATION:  [facility]                          FACILITY:  [facility]  PHYSICIAN:  Servando Salina, M.D.DATE OF BIRTH:  Aug 18, 1955  DATE OF PROCEDURE:  02/18/2016 DATE OF DISCHARGE:  02/18/2016                              OPERATIVE REPORT   PREOPERATIVE DIAGNOSIS:  Endometrial thickening on sonogram, endometrial mass.  PROCEDURE:  Diagnostic hysteroscopy, hysteroscopic resection of submucosal fibroid, endometrial polyps using MyoSure, dilation and curettage.  POSTOPERATIVE DIAGNOSIS:  Submucosal fibroid, calcified, endometrial polyps.  ANESTHESIA:  General.  SURGEON:  Servando Salina, MD.  ASSISTANT:  None.  DESCRIPTION OF PROCEDURE:  Under adequate general anesthesia, the patient was placed in the dorsal lithotomy position.  She was sterilely prepped and draped in usual fashion.  The bladder was catheterized with moderate amount of urine.  Examination under anesthesia revealed irregular enlarged retroverted uterus.  No adnexal masses could be appreciated.  A bivalve speculum was placed in the vagina.  Single-tooth tenaculum was placed on the anterior lip of the cervix.  The cervix was easily dilated up to #23 [facility] dilator.  A MyoSure hysteroscope was introduced into the uterine cavity.  On entry into the cavity, it was noted that there was an anterior polypoid lesion, a submucosal fibroid with a midline adhesion from the fibroid to the posterior wall, and a posterior polypoid wide-based elongated lesion noted as well.  Using the Reach myosure resectoscope, the polypoid lesions were both resected.  The posterior one was off the left sidewall.  The adhesion in the midline was then also resected followed by resection of the submucosal fibroid. In the process of resecting the fibroid, it was noted to be predominantly a calcified fibroid with evidence of yellow in the base of the fibroid.   The fibroid itself was not entirely resected due to the hardness of the calcification and due to the patient not having any problems with bleeding.  Decision was made to forego attempting to trying to further resect the fibroid itself and leaving the remaining portion of it in its place.  The patient's history is notable for not only an endometrial ablation but also uterine artery embolization 6 years ago.  The instruments were then removed and cavity was gently curetted for scant amount of tissue.  The remaining endometrium was atrophic and the tubal ostia could be seen.  SPECIMEN:  Fibroid resection and endometrial polyps with endometrial curetting sent to Pathology.  FLUID DEFICIT:  1435 ml.  COMPLICATION:  None.  The patient tolerated the procedure well, was transferred to the recovery room in stable condition.     Servando Salina, M.D.     [place]/MEDQ  D:  02/18/2016  T:  02/19/2016  Job:  [id number]

## 2016-02-20 NOTE — Anesthesia Postprocedure Evaluation (Signed)
Anesthesia Post Note  Patient: Carrie [name]  Procedure(s) Performed: Procedure(s) (LRB): DILATATION & CURETTAGE/HYSTEROSCOPY WITH MYOSURE (N/A)  Anesthesia Post Evaluation  Last Vitals:  Vitals:   02/18/16 1515 02/18/16 1545  BP: 120/64 129/60  Pulse: (!) 58 (!) 56  Resp: 12 16  Temp:      Last Pain:  Vitals:   02/18/16 1515  TempSrc:   PainSc: 3                  Catalina Gravel

## 2016-02-21 ENCOUNTER — Encounter (HOSPITAL_COMMUNITY): Payer: Self-pay | Admitting: Obstetrics and Gynecology

## 2016-02-21 NOTE — Addendum Note (Signed)
Addendum  created 02/21/16 [id number] by Laverle Hobby, CRNA   Charge Capture section accepted

## 2016-03-24 ENCOUNTER — Emergency Department (HOSPITAL_COMMUNITY)
Admission: EM | Admit: 2016-03-24 | Discharge: 2016-03-24 | Disposition: A | Discharge status: Home / Self Care | Payer: BLUE CROSS/BLUE SHIELD | Attending: Dermatology | Admitting: Dermatology

## 2016-03-24 ENCOUNTER — Encounter (HOSPITAL_COMMUNITY): Payer: Self-pay

## 2016-03-24 DIAGNOSIS — Y92410 Unspecified street and highway as the place of occurrence of the external cause: Secondary | ICD-10-CM | POA: Insufficient documentation

## 2016-03-24 DIAGNOSIS — Z79899 Other long term (current) drug therapy: Secondary | ICD-10-CM | POA: Diagnosis not present

## 2016-03-24 DIAGNOSIS — Z5321 Procedure and treatment not carried out due to patient leaving prior to being seen by health care provider: Secondary | ICD-10-CM | POA: Insufficient documentation

## 2016-03-24 DIAGNOSIS — Y9389 Activity, other specified: Secondary | ICD-10-CM | POA: Insufficient documentation

## 2016-03-24 DIAGNOSIS — Y999 Unspecified external cause status: Secondary | ICD-10-CM | POA: Insufficient documentation

## 2016-03-24 DIAGNOSIS — M79642 Pain in left hand: Secondary | ICD-10-CM | POA: Insufficient documentation

## 2016-03-24 DIAGNOSIS — E039 Hypothyroidism, unspecified: Secondary | ICD-10-CM | POA: Insufficient documentation
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
## 2016-03-25 DIAGNOSIS — M25532 Pain in left wrist: Secondary | ICD-10-CM | POA: Diagnosis not present

## 2016-03-25 DIAGNOSIS — M545 Low back pain: Secondary | ICD-10-CM | POA: Diagnosis not present

## 2016-04-10 DIAGNOSIS — H04123 Dry eye syndrome of bilateral lacrimal glands: Secondary | ICD-10-CM | POA: Diagnosis not present

## 2016-04-10 DIAGNOSIS — E119 Type 2 diabetes mellitus without complications: Secondary | ICD-10-CM | POA: Diagnosis not present

## 2016-04-10 DIAGNOSIS — H2513 Age-related nuclear cataract, bilateral: Secondary | ICD-10-CM | POA: Diagnosis not present

## 2016-05-07 DIAGNOSIS — R1013 Epigastric pain: Secondary | ICD-10-CM | POA: Diagnosis not present

## 2016-05-21 DIAGNOSIS — R1013 Epigastric pain: Secondary | ICD-10-CM | POA: Diagnosis not present

## 2016-06-02 DIAGNOSIS — K219 Gastro-esophageal reflux disease without esophagitis: Secondary | ICD-10-CM | POA: Diagnosis not present

## 2016-06-02 DIAGNOSIS — R1013 Epigastric pain: Secondary | ICD-10-CM | POA: Diagnosis not present

## 2016-06-02 DIAGNOSIS — R194 Change in bowel habit: Secondary | ICD-10-CM | POA: Diagnosis not present

## 2016-06-02 DIAGNOSIS — Z1211 Encounter for screening for malignant neoplasm of colon: Secondary | ICD-10-CM | POA: Diagnosis not present

## 2016-06-17 DIAGNOSIS — K621 Rectal polyp: Secondary | ICD-10-CM | POA: Diagnosis not present

## 2016-06-17 DIAGNOSIS — K219 Gastro-esophageal reflux disease without esophagitis: Secondary | ICD-10-CM | POA: Diagnosis not present

## 2016-06-17 DIAGNOSIS — K317 Polyp of stomach and duodenum: Secondary | ICD-10-CM | POA: Diagnosis not present

## 2016-06-17 DIAGNOSIS — Z1211 Encounter for screening for malignant neoplasm of colon: Secondary | ICD-10-CM | POA: Diagnosis not present

## 2016-06-17 DIAGNOSIS — K635 Polyp of colon: Secondary | ICD-10-CM | POA: Diagnosis not present

## 2016-06-17 DIAGNOSIS — D120 Benign neoplasm of cecum: Secondary | ICD-10-CM | POA: Diagnosis not present

## 2016-06-17 DIAGNOSIS — R1013 Epigastric pain: Secondary | ICD-10-CM | POA: Diagnosis not present

## 2016-06-17 DIAGNOSIS — D128 Benign neoplasm of rectum: Secondary | ICD-10-CM | POA: Diagnosis not present

## 2016-08-31 ENCOUNTER — Ambulatory Visit: Payer: Self-pay | Admitting: Medical

## 2016-08-31 VITALS — BP 140/78 | HR 69 | Temp 97.9°F | Resp 16 | Ht 74.0 in | Wt 208.0 lb

## 2016-08-31 DIAGNOSIS — M5431 Sciatica, right side: Secondary | ICD-10-CM

## 2016-08-31 DIAGNOSIS — L659 Nonscarring hair loss, unspecified: Secondary | ICD-10-CM

## 2016-08-31 DIAGNOSIS — K921 Melena: Secondary | ICD-10-CM

## 2016-08-31 DIAGNOSIS — R5383 Other fatigue: Secondary | ICD-10-CM

## 2016-08-31 NOTE — Progress Notes (Signed)
   Subjective:    Patient ID: Carrie [name], female    DOB: 1955/06/15, 61 y.o.   MRN: [id number]  HPI  61 yo female noticed blood (red in stool) , some in the toilet. None on the tissue afer wiping., no pain on bowel movement occurred Wednesday of last week. Taking probiotics. Had endoscope/ colonoscopy with polyps removed  and diagnosis of diverticulitis per patient 2 months ago ( Feb 2018). Started to have lower back pain radiating into the lateral right side of thigh today. Concerned about her kidney function. Leaving for [place] for vacation from Thursday to Sunday and wanted to be checked. Has had history of hemorrhoids in the past. Pending primary appointment on Wednesday for back pain and alopeci and would like lab work for her appointment. History of sciatica on the right side with back pain more than 1 year ago, notices it mostly when gaining weight  (weighing in at 208-2010lbs). Weighed in at 208 lbs today. She would also like her thyroid checked due to hair loss, She will review labs with her Primary doctor on her Wednesday appointment.   Review of Systems  Constitutional: Negative for appetite change, chills and fever.  HENT: Negative.   Eyes: Negative.   Respiratory: Negative.   Cardiovascular: Negative.   Gastrointestinal: Positive for abdominal distention, blood in stool and constipation. Negative for anal bleeding, diarrhea, nausea, rectal pain and vomiting.  Endocrine: Negative.   Genitourinary: Negative.   Musculoskeletal: Positive for back pain.  Skin: Negative.   Neurological: Negative.   back pain located on the lower right side notices it  with sitting and  getting up into a standing position. Does yoga stretches in the morning. History of fatigue x  2 months.    Objective:   Physical Exam  Constitutional: She is oriented to person, place, and time. She appears well-developed and well-nourished.  HENT:  Head: Normocephalic and atraumatic.  Eyes: EOM are normal.  Pupils are equal, round, and reactive to light.  Neck: Normal range of motion.  Cardiovascular: Normal rate, regular rhythm and normal heart sounds.  Exam reveals no gallop and no friction rub.   No murmur heard. Pulmonary/Chest: Effort normal and breath sounds normal.  Neurological: She is alert and oriented to person, place, and time.  Skin: Skin is warm and dry. No rash noted.  Psychiatric: She has a normal mood and affect. Her behavior is normal.  Nursing note and vitals reviewed.    Tender on right SI joint down into buttock, no CVA tenderness, skin within normal limits. Negative SLR bilaterally. 2+ popliteal reflexes.      patient defers rectal today. Assessment & Plan:  Blood in stool and right lower back pain with sciatica. Hairloss and fatigue.  Patient would like her kidneys checked prior to leaving the country.  Will do CBC Met C TSH.Vit D. Will try to get results prior to her appointment with her primary doctor [facility] physicians fax number is  [phone]. When patient has time to fast will do lipid panel and glucose level. Had patient stop tumeric powder and any NSIDS like  (motrin, advil, ibuprofen or aleve) to just use tylenol take as directed.

## 2016-09-01 LAB — CBC WITH DIFFERENTIAL/PLATELET
BASOS: 0 %
Basophils Absolute: 0 10*3/uL (ref 0.0–0.2)
EOS (ABSOLUTE): 0.1 10*3/uL (ref 0.0–0.4)
Eos: 3 %
Hematocrit: 33.4 % — ABNORMAL LOW (ref 34.0–46.6)
Hemoglobin: 11.2 g/dL (ref 11.1–15.9)
IMMATURE GRANS (ABS): 0 10*3/uL (ref 0.0–0.1)
Immature Granulocytes: 0 %
LYMPHS: 32 %
Lymphocytes Absolute: 1.3 10*3/uL (ref 0.7–3.1)
MCH: 26.2 pg — ABNORMAL LOW (ref 26.6–33.0)
MCHC: 33.5 g/dL (ref 31.5–35.7)
MCV: 78 fL — ABNORMAL LOW (ref 79–97)
MONOS ABS: 0.2 10*3/uL (ref 0.1–0.9)
Monocytes: 4 %
Neutrophils Absolute: 2.5 10*3/uL (ref 1.4–7.0)
Neutrophils: 61 %
PLATELETS: 213 10*3/uL (ref 150–379)
RBC: 4.27 x10E6/uL (ref 3.77–5.28)
RDW: 17 % — AB (ref 12.3–15.4)
WBC: 4.2 10*3/uL (ref 3.4–10.8)

## 2016-09-01 LAB — VITAMIN D 25 HYDROXY (VIT D DEFICIENCY, FRACTURES): VIT D 25 HYDROXY: 21.2 ng/mL — AB (ref 30.0–100.0)

## 2016-09-01 LAB — TSH: TSH: 0.729 u[IU]/mL (ref 0.450–4.500)

## 2016-09-02 DIAGNOSIS — E559 Vitamin D deficiency, unspecified: Secondary | ICD-10-CM | POA: Diagnosis not present

## 2016-09-02 DIAGNOSIS — R5383 Other fatigue: Secondary | ICD-10-CM | POA: Diagnosis not present

## 2016-09-02 DIAGNOSIS — E010 Iodine-deficiency related diffuse (endemic) goiter: Secondary | ICD-10-CM | POA: Diagnosis not present

## 2016-09-03 ENCOUNTER — Other Ambulatory Visit: Payer: Self-pay | Admitting: Family Medicine

## 2016-09-03 DIAGNOSIS — E010 Iodine-deficiency related diffuse (endemic) goiter: Secondary | ICD-10-CM

## 2016-09-18 NOTE — Progress Notes (Signed)
Faxed over to patient primary care doctor.

## 2016-11-27 DIAGNOSIS — H5711 Ocular pain, right eye: Secondary | ICD-10-CM | POA: Diagnosis not present

## 2016-11-27 DIAGNOSIS — H10413 Chronic giant papillary conjunctivitis, bilateral: Secondary | ICD-10-CM | POA: Diagnosis not present

## 2016-11-27 DIAGNOSIS — H04123 Dry eye syndrome of bilateral lacrimal glands: Secondary | ICD-10-CM | POA: Diagnosis not present

## 2016-12-16 ENCOUNTER — Encounter: Payer: Self-pay | Admitting: Adult Health

## 2016-12-16 ENCOUNTER — Ambulatory Visit: Payer: Self-pay | Admitting: Adult Health

## 2016-12-16 ENCOUNTER — Ambulatory Visit: Payer: Self-pay | Admitting: Medical

## 2016-12-16 VITALS — BP 140/70 | HR 71 | Temp 97.9°F | Resp 16 | Ht 74.0 in | Wt 211.0 lb

## 2016-12-16 DIAGNOSIS — H5711 Ocular pain, right eye: Secondary | ICD-10-CM

## 2016-12-16 DIAGNOSIS — H1089 Other conjunctivitis: Secondary | ICD-10-CM | POA: Diagnosis not present

## 2016-12-16 NOTE — Progress Notes (Signed)
Subjective:    Patient ID: Carrie [name], female    DOB: 08/01/1955, 61 y.o.   MRN: [id number]  HPI  Patient is a 61 year old female who is in no acute distress, she reports her right eye red was painful for  two months or more.  She says  She saw Dr. Franklyn Lor in [place], she  Saw him  and he did not visualize foreign body. She reports she saw an eye doctor two  weeks ago.She has been using  warm compresses as instructed by Eye MD. She reports she had a dilated eye exam and thought she was clearing up after that eye exam feels her eye is worsening now. She wears contacts but only wore them two days last week. She has not been wearing contacts since then.  She denies any eye injury.  She denies any chemical or foreign body exposure.She denies nay recent illness or exposure.  Denies trying any over the counter medications oir prescriptions for her eye.    She reports right eye with yellow mucous on eyelids when she awakens. She reports pain in right eye  is a 5/10and she reports it  feels like the  pain is  in top of right eye under her right eye lid. She describes the pain as pressure/throbbing at times.  She reports right eye as throbbing last night. Right eye blurred vision at times per her report.  She denies any recent illness, fever , chills, headaches or facial pain.  Blood pressure 140/70, pulse 71, temperature 97.9 F (36.6 C), resp. rate 16, height 6\' 2"  (1.88 m), weight 211 lb (95.7 kg), SpO2 99 %.  Current Outpatient Prescriptions:  .  Ascorbic Acid (VITAMIN C) 100 MG tablet, Take 100 mg by mouth daily.  , Disp: , Rfl:  .  Calcium-Magnesium-Vitamin D (CALCIUM MAGNESIUM PO), Take 1 tablet by mouth 2 (two) times a week., Disp: , Rfl:  .  cholecalciferol (VITAMIN D) 1000 UNITS tablet, Take 1,000 Units by mouth daily.  , Disp: , Rfl:  .  Fexofenadine-Pseudoephedrine (ALLEGRA-D 24 HOUR PO), Take 1 capsule by mouth daily as needed. FOR CONGESTION , Disp: , Rfl:  .  FOLIC ACID PO, Take  1 tablet by mouth daily., Disp: , Rfl:  .  GLUCOSAMINE HCL PO, Take 1 tablet by mouth 3 (three) times a week., Disp: , Rfl:  .  Multiple Vitamin (MULTIVITAMIN) tablet, Take 1 tablet by mouth daily., Disp: , Rfl:  .  Probiotic Product (PROBIOTIC ADVANCED PO), Take 1 capsule by mouth 3 (three) times a week. , Disp: , Rfl:  .  Turmeric POWD, by Does not apply route. 1 teaspoon 3 times weekly, Disp: , Rfl:  .  fluticasone (FLONASE) 50 MCG/ACT nasal spray, Place 2 sprays into the nose daily as needed for rhinitis. , Disp: , Rfl:  .  oxyCODONE (ROXICODONE) 5 MG immediate release tablet, Take 1 tablet (5 mg total) by mouth every 4 (four) hours as needed for severe pain. (Patient not taking: Reported on 08/31/2016), Disp: 30 tablet, Rfl: 0    Review of Systems  Constitutional: Negative for activity change, appetite change, chills, diaphoresis, fatigue, fever and unexpected weight change.  HENT: Negative.   Eyes: Positive for photophobia (mild per patient ), pain (right eye x 2 months ), discharge, redness (right outer cornea/ conjunctiva ) and visual disturbance (right eye blurred at times per patinet report ). Negative for itching.  Respiratory: Negative for apnea, cough, choking, chest tightness, shortness  of breath, wheezing and stridor.   Cardiovascular: Negative for chest pain, palpitations and leg swelling.  Gastrointestinal: Negative.   Genitourinary: Negative.   Neurological: Negative for dizziness, tremors, seizures, syncope, facial asymmetry, speech difficulty, weakness, light-headedness, numbness and headaches.  Psychiatric/Behavioral: Negative.        Objective:   Physical Exam  Constitutional: She appears well-developed and well-nourished. No distress.  HENT:  Head: Normocephalic and atraumatic.  Right Ear: Hearing, tympanic membrane, external ear and ear canal normal.  Left Ear: Hearing, tympanic membrane, external ear and ear canal normal.  Eyes: EOM are normal. Lids are everted  and swept, no foreign bodies found. Right eye exhibits discharge (clear watery as well as yellow mucous upon wakening ) and exudate. Left eye exhibits no exudate. No foreign body present in the left eye. Right conjunctiva is injected. Left conjunctiva is not injected. Left conjunctiva has no hemorrhage. Left eye exhibits normal extraocular motion and no nystagmus. Right pupil is round and reactive. Left pupil is round and reactive. Pupils are equal.  Fundoscopic exam:      The right eye shows red reflex.       The left eye shows red reflex.  Slit lamp exam:      The right eye shows corneal abrasion and fluorescein uptake (right outer corner with small area of uptake ). The right eye shows no foreign body and no anterior chamber bulge.    Neck: Trachea normal.  Skin: She is not diaphoretic.          Assessment & Plan:  1. Refer to [facility] now due to eye pain and changes in vision. Persistent eye pain. Abnormal Fluorescein Stain of right eye with positive uptake.   2. Patient will be seen at [facility] now, Delena Serve scheduled appointment for today 12/16/16 . She will follow up with Eye MD regarding eye and return to the clinic as needed. She is to go to urgent care  or Emergency room if any symptoms change, worsen or new symptoms arise.  Patient verbalized understanding of all the above instructions.

## 2016-12-17 ENCOUNTER — Telehealth: Payer: Self-pay | Admitting: Adult Health

## 2016-12-17 ENCOUNTER — Encounter: Payer: Self-pay | Admitting: *Deleted

## 2016-12-17 ENCOUNTER — Other Ambulatory Visit: Payer: Self-pay | Admitting: Family Medicine

## 2016-12-17 ENCOUNTER — Telehealth: Payer: Self-pay | Admitting: *Deleted

## 2016-12-17 DIAGNOSIS — D580 Hereditary spherocytosis: Secondary | ICD-10-CM | POA: Diagnosis not present

## 2016-12-17 DIAGNOSIS — E010 Iodine-deficiency related diffuse (endemic) goiter: Secondary | ICD-10-CM

## 2016-12-17 DIAGNOSIS — E559 Vitamin D deficiency, unspecified: Secondary | ICD-10-CM | POA: Insufficient documentation

## 2016-12-17 DIAGNOSIS — Z1211 Encounter for screening for malignant neoplasm of colon: Secondary | ICD-10-CM | POA: Insufficient documentation

## 2016-12-17 DIAGNOSIS — R739 Hyperglycemia, unspecified: Secondary | ICD-10-CM | POA: Insufficient documentation

## 2016-12-17 DIAGNOSIS — M542 Cervicalgia: Secondary | ICD-10-CM | POA: Insufficient documentation

## 2016-12-17 DIAGNOSIS — R7303 Prediabetes: Secondary | ICD-10-CM | POA: Diagnosis not present

## 2016-12-17 DIAGNOSIS — R519 Headache, unspecified: Secondary | ICD-10-CM | POA: Insufficient documentation

## 2016-12-17 DIAGNOSIS — Z13220 Encounter for screening for lipoid disorders: Secondary | ICD-10-CM | POA: Diagnosis not present

## 2016-12-17 DIAGNOSIS — Z0000 Encounter for general adult medical examination without abnormal findings: Secondary | ICD-10-CM | POA: Diagnosis not present

## 2016-12-17 DIAGNOSIS — R51 Headache: Secondary | ICD-10-CM

## 2016-12-17 DIAGNOSIS — Z23 Encounter for immunization: Secondary | ICD-10-CM | POA: Diagnosis not present

## 2016-12-17 NOTE — Telephone Encounter (Signed)
Follow up call to patient on 12/17/16 at 1: 10 pm  From viist oon 7/26 for right eye pain and redness. Patient returned call 1:12 pm and reports she did seen the eye doctor at [facility] center and was diagnosed with a corneal ulcer. She was treated and given prescription drops and has an appointment on 12/21/16 for follow up eye recheck. She denies nay new symptoms and reports her right eye is feeling slightly better already. She is appreciative of  Referral. Advised to follow up with clinic as needed. Patient verbalized understanding of all of the above.

## 2016-12-17 NOTE — Patient Instructions (Signed)
Possible Corneal Abrasion/ Will see  Eye today  A corneal abrasion is a scratch or injury to the clear covering over the front of your eye (cornea). Your cornea forms a clear dome that protects your eye and helps to focus your vision. Your cornea is made up of many layers. The surface layer is a single layer of cells (corneal epithelium). It is one of the most sensitive tissues in your body. A corneal abrasion can be very painful. If a corneal abrasion is not treated, it can become infected and cause an ulcer. This can lead to scarring. A scarred cornea can affect your vision. Sometimes abrasions come back in the same area, even after the original injury has healed (recurrent erosion syndrome). What are the causes? This condition may be caused by:  A poke in the eye.  A gritty or irritating substance (foreign body) in the eye.  Excessive eye rubbing.  Very dry eyes.  Certain eye infections.  Contact lenses that fit poorly or are worn for a long period of time. You can also injure your cornea when putting contacts lenses in your eye or taking them out.  Eye surgery.  Sometimes, the cause is unknown. What are the signs or symptoms? Symptoms of this condition include:  Eye pain. The pain may get worse when your eye is open or when you move your eye.  A feeling of something stuck in your eye.  Having trouble keeping your eye open, or not being able to keep it open.  Tearing and redness.  Sensitivity to light.  Blurred vision.  Headache.  How is this diagnosed? This condition may be diagnosed based on:  Your medical history.  Your symptoms.  An eye exam. You may work with a health care provider who specializes in diseases and conditions of the eye (ophthalmologist). Before the eye exam, numbing drops may be put into your eye. You may also have dye put in your eye with a dropper or a small paper strip. The dye makes the abrasion easy to see when your ophthalmologist  examines your eye with a light. Your ophthalmologist may look at your eye through an eye scope (slit lamp).  How is this treated? Treatment may vary depending on the cause of your condition, and it may include:  Washing out your eye.  Removing any foreign body.  Antibiotic drops or ointment to treat an infection.  Steroid drops or ointment to treat redness, irritation, or inflammation.  Pain medicine.  An eye patch to keep your eye closed.  Follow these instructions at home: Medicines  Use eye drops or ointments as told by your eye care provider.  If you were prescribed antibiotic drops or ointment, use them as told by your eye care provider. Do not stop using the antibiotic even if you start to feel better.  Take over-the-counter and prescription medicines only as told by your eye care provider.  Do not drive or use heavy machinery while taking prescription pain medicine. General instructions  If you have an eye patch, wear it as told by your eye care provider. ? Do not drive or use machinery while wearing an eye patch. Your ability to judge distances will be impaired. ? Follow instructions from your eye care provider about when to remove the patch.  Ask your eye care provider whether you can use a cold, wet cloth (compress) on your eye to relieve pain.  Do not rub or touch your eye. Do not wash out your eye.  Do not wear contact lenses until your eye care provider says that this is okay.  Avoid bright light and eye strain.  Keep all follow-up visits as told by your eye care provider. This is important for preventing infection and vision loss. Contact a health care provider if:  You continue to have eye pain and other symptoms for more than 2 days.  You develop new symptoms, such as redness, tearing, or discharge.  You have discharge that makes your eyelids stick together in the morning.  Your eye patch becomes so loose that you can blink your eye.  Symptoms  return after the original abrasion has healed. Get help right away if:  You have severe eye pain that does not get better with medicine.  You have vision loss. Summary  A corneal abrasion is a scratch on the outer layer of the clear covering over the front of your eye (cornea).  Corneal abrasion can cause eye pain, redness, tearing, and blurred vision.  This condition is usually treated with medicine to prevent infection and scarring. You also may have to wear an eye patch to cover your eye.  Let your eye care provider know if your symptoms continue for more than 2 days. This information is not intended to replace advice given to you by your health care provider. Make sure you discuss any questions you have with your health care provider. Document Released: 05/08/2000 Document Revised: 04/21/2016 Document Reviewed: 04/21/2016 Elsevier Interactive Patient Education  2017 [employer].

## 2016-12-17 NOTE — Telephone Encounter (Signed)
Referral request from Leighton Ruff, MD of [facility] @ [facility] sent to scheduling

## 2016-12-21 DIAGNOSIS — H1089 Other conjunctivitis: Secondary | ICD-10-CM | POA: Diagnosis not present

## 2016-12-23 ENCOUNTER — Telehealth: Payer: Self-pay | Admitting: Medical

## 2016-12-23 NOTE — Telephone Encounter (Signed)
Called  12/22/16 patient to see how her corneal abrasion is doing. She saw the eye MD this on Monday and she says he told her it is  90% healed to continue eye drops another week and return to the clinic as needed. She was very thankful for our care.

## 2016-12-28 ENCOUNTER — Encounter: Payer: Self-pay | Admitting: Physician Assistant

## 2016-12-29 ENCOUNTER — Ambulatory Visit
Admission: RE | Admit: 2016-12-29 | Discharge: 2016-12-29 | Disposition: A | Discharge status: Home / Self Care | Payer: BLUE CROSS/BLUE SHIELD | Source: Ambulatory Visit | Attending: Family Medicine | Admitting: Family Medicine

## 2016-12-29 DIAGNOSIS — E041 Nontoxic single thyroid nodule: Secondary | ICD-10-CM | POA: Diagnosis not present

## 2016-12-29 DIAGNOSIS — E010 Iodine-deficiency related diffuse (endemic) goiter: Secondary | ICD-10-CM

## 2017-01-14 ENCOUNTER — Encounter: Payer: Self-pay | Admitting: Physician Assistant

## 2017-01-14 ENCOUNTER — Ambulatory Visit (INDEPENDENT_AMBULATORY_CARE_PROVIDER_SITE_OTHER): Payer: BLUE CROSS/BLUE SHIELD | Admitting: Physician Assistant

## 2017-01-14 VITALS — BP 126/74 | HR 75 | Ht 74.0 in | Wt 212.1 lb

## 2017-01-14 DIAGNOSIS — Z8249 Family history of ischemic heart disease and other diseases of the circulatory system: Secondary | ICD-10-CM | POA: Diagnosis not present

## 2017-01-14 DIAGNOSIS — R0600 Dyspnea, unspecified: Secondary | ICD-10-CM | POA: Insufficient documentation

## 2017-01-14 DIAGNOSIS — R0609 Other forms of dyspnea: Secondary | ICD-10-CM | POA: Diagnosis not present

## 2017-01-14 DIAGNOSIS — R079 Chest pain, unspecified: Secondary | ICD-10-CM | POA: Diagnosis not present

## 2017-01-14 MED ORDER — ASPIRIN EC 81 MG PO TBEC: 81.0000 mg | DELAYED_RELEASE_TABLET | Freq: Every day | ORAL | 3 refills | Status: DC

## 2017-01-14 NOTE — Patient Instructions (Signed)
Medication Instructions:  START Aspirin 81 mg daily   Labwork: None ordered  Testing/Procedures: Your physician has requested that you have a stress echocardiogram. For further information please visit [email or web address]. Please follow instruction sheet as given.   Follow-Up: Based on test results  Any Other Special Instructions Will Be Listed Below (If Applicable).     If you need a refill on your cardiac medications before your next appointment, please call your pharmacy.

## 2017-01-14 NOTE — Progress Notes (Signed)
Cardiology Office Note    Date:  01/14/2017   ID:  Carrie [name], DOB [birth year], MRN [id number]  PCP:  Leighton Ruff, MD  Cardiologist:  New to Dr. Saunders Revel   Chief Complaint: Dizziness and chest pain   History of Present Illness:   Carrie [name] is a 61 y.o. female with past medical history of vitamin D deficiency and prediabetic referred for evaluation of dizziness by Dr. Drema Dallas.   Recently noted an enlarged thyroid-->  Ultrasound showed loft lower thyroid nodule. Recommended annual surveillance. EKG 12/17/16 showed normal sinus rhythm. Normal lab work by PCP - personality reviewed. LDL 68, HDL 46, A1c 4, hemoglobin 11.7, serum creatinine 0.7, potassium 4.3, TSH 0.7.  The [name] had a normal cath 1in [phone] for CP. Felt allergic reaction to Celebrex. Over the past couple of years [name] has noted substernal chest pressure, shortness of breath and heaviness in her head with light weight lifting. Symptoms quickly resolved within 2-3 minutes. She has stopped doing exercise 6 months ago. Since then [name] has complained of "dizzy spell"--> intermittently gets shortness of breath, dizziness and heaviness in her head. Symptoms resolved within 30 seconds. No reoccurrence in past 4 weeks. No radiation, nausea or vomiting. Remote history of GERD however different symptoms. She denies orthopnea, PND, syncope, lower extremity edema or palpitation. She complains of dyspnea on exertion, especially climbing stairs.   No history of tobacco smoking. Father had a first MI in 2s and died of MI @ age 65. Mother had a history of stroke. Paternal grandfather and uncle had MI in their 22s or 56s.  Past Medical History:  Diagnosis Date  . Allergy   . Anemia    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  . Anemia   . Arthritis    back  . Boil, breast   . Bronchitis 3+yrs ago  . Chronic back pain    hx of;pt lost weight and does yoga and back pain gone  . Complication of anesthesia     increased Heart Rate after knee surgery  . Cyst 1976   left arm   . Eczema   . 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 Fibroids 2009   embolization of fibroids  . GERD (gastroesophageal reflux disease)    takes Omeprazole daily prn, history of  . Headache   . Heart murmur   . History of bladder infections 25+yrs ago   used to see a urologist  . History of IBS   . Hyperlipidemia   . Hypothyroidism    hx of 30+yrs ago;was on meds x couple of months and has been fine since  . Knee pain   . Pre-diabetes   . Seasonal allergies    takes Allegra as needed as well as using Flonase  . Vertigo     Past Surgical History:  Procedure Laterality Date  . CHOLECYSTECTOMY  05/08/2011   Procedure: LAPAROSCOPIC CHOLECYSTECTOMY WITH INTRAOPERATIVE CHOLANGIOGRAM;  Surgeon: Harl Bowie, MD;  Location: [place];  Service: General;  Laterality: N/A;  LAPAROSCOPIC CHOLECYSTECTOMY WITH INTRAOPERATIVE CHOLANGIOGRAM  . CHOLECYSTECTOMY    . COLONOSCOPY    . DILATATION & CURETTAGE/HYSTEROSCOPY WITH MYOSURE N/A 02/18/2016   Procedure: DILATATION & CURETTAGE/HYSTEROSCOPY WITH MYOSURE;  Surgeon: Servando Salina, MD;  Location: [facility];  Service: Gynecology;  Laterality: N/A;  . ENDOMETRIAL ABLATION  2011  . KNEE SURGERY  15+yrs ago   left knee  . KNEE SURGERY    . TONSILLECTOMY  and adenoids as child  . TUBAL LIGATION  1988    Current Medications: Prior to Admission medications   Medication Sig Start Date End Date Taking? Authorizing Provider  Ascorbic Acid (VITAMIN C) 100 MG tablet Take 100 mg by mouth daily.      [provider]  Calcium-Magnesium-Vitamin D (CALCIUM MAGNESIUM PO) Take 1 tablet by mouth 2 (two) times a week.    [provider]  cholecalciferol (VITAMIN D) 1000 UNITS tablet Take 1,000 Units by mouth daily.      [provider]  Fexofenadine-Pseudoephedrine (ALLEGRA-D 24 HOUR PO) Take 1 capsule by mouth daily as needed. FOR  CONGESTION     [provider]  fluticasone (FLONASE) 50 MCG/ACT nasal spray Place 2 sprays into the nose daily as needed for rhinitis.     [provider]  FOLIC ACID PO Take 1 tablet by mouth daily.    [provider]  GLUCOSAMINE HCL PO Take 1 tablet by mouth 3 (three) times a week.    [provider]  Multiple Vitamin (MULTIVITAMIN) tablet Take 1 tablet by mouth daily.    [provider]  oxyCODONE (ROXICODONE) 5 MG immediate release tablet Take 1 tablet (5 mg total) by mouth every 4 (four) hours as needed for severe pain. [name] not taking: Reported on 08/31/2016 02/18/16   Servando Salina, MD  Probiotic Product (PROBIOTIC ADVANCED PO) Take 1 capsule by mouth 3 (three) times a week.     [provider]  Turmeric POWD by Does not apply route. 1 teaspoon 3 times weekly    [provider]    Allergies:   Anesthetics, amide; Celebrex [celecoxib]; and Celecoxib   Social History   Social History  . Marital status: Married    Spouse name: N/A  . Number of children: N/A  . Years of education: N/A   Social History Main Topics  . Smoking status: Never Smoker  . Smokeless tobacco: Never Used  . Alcohol use Yes     Comment:  wine once a week  . Drug use: No  . Sexual activity: No   Other Topics Concern  . None   Social History Narrative   ** Merged History Encounter **         Family History:  The [name]'s family history includes Breast cancer in her paternal aunt; CAD in her father and mother; CVA in her mother; Cerebral palsy in her sister; Diabetes in her father and mother; Hereditary spherocytosis in her child and mother.   ROS:   Please see the history of present illness.    ROS All other systems reviewed and are negative.   PHYSICAL EXAM:   VS:  BP 126/74 (BP Location: Right Arm)   Pulse 75   Ht 6\' 2"  (1.88 m)   Wt 212 lb 1.9 oz (96.2 kg)   BMI 27.23 kg/m    GEN: Well nourished, well developed, in no  acute distress  HEENT: normal  Neck: no JVD, carotid bruits, or masses Cardiac: RRR; no murmurs, rubs, or gallops,no edema  Respiratory:  clear to auscultation bilaterally, normal work of breathing GI: soft, nontender, nondistended, + BS MS: no deformity or atrophy  Skin: warm and dry, no rash Neuro:  Alert and Oriented x 3, Strength and sensation are intact Psych: euthymic mood, full affect  Wt Readings from Last 3 Encounters:  01/14/17 212 lb 1.9 oz (96.2 kg)  12/16/16 211 lb (95.7 kg)  08/31/16 208 lb (94.3 kg)  Studies/Labs Reviewed:   EKG:  EKG is not ordered today.    Recent Labs: 08/31/2016: Hemoglobin 11.2; Platelets 213; TSH 0.729   Lipid Panel No results found for: CHOL, TRIG, HDL, CHOLHDL, VLDL, LDLCALC, LDLDIRECT  Additional studies/ records that were reviewed today include:  As above    ASSESSMENT & PLAN:    1. Chest pain/DOE - Her symptoms is very vague. No reoccurrence in past 4 weeks. She had a normal cardiac catheterization approximately 20 years ago.  She has a strong family history of cardiac disease. No other significant cardiac risk factors. We will get a stress echocardiogram to evaluate LV function. EKG reassuring.   2. Dizzy spell - Nonspecific. Stress echocardiogram as discussed above. Further evaluation per primary care provider.    Medication Adjustments/Labs and Tests Ordered: Current medicines are reviewed at length with the [name] today.  Concerns regarding medicines are outlined above.  Medication changes, Labs and Tests ordered today are listed in the [name] Instructions below. [name] Instructions  Medication Instructions:  START Aspirin 81 mg daily   Labwork: None ordered  Testing/Procedures: Your physician has requested that you have a stress echocardiogram. For further information please visit [email or web address]. Please follow instruction sheet as given.   Follow-Up: Based on test results  Any Other Special  Instructions Will Be Listed Below (If Applicable).     If you need a refill on your cardiac medications before your next appointment, please call your pharmacy.      Jarrett Soho, [place]  01/14/2017 3:17 PM    [facility] [address], [place], [place]  [address] Phone: [phone]; Fax: [phone]

## 2017-02-11 ENCOUNTER — Telehealth (HOSPITAL_COMMUNITY): Payer: Self-pay | Admitting: *Deleted

## 2017-02-11 NOTE — Telephone Encounter (Signed)
Left message on voicemail per DPR in reference to upcoming appointment scheduled on 02/16/17 at 2:30 with detailed instructions given per Stress Test Requisition Sheet for the test. LM to arrive 30 minutes early, and that it is imperative to arrive on time for appointment to keep from having the test rescheduled. If you need to cancel or reschedule your appointment, please call the office within 24 hours of your appointment. Failure to do so may result in a cancellation of your appointment, and a $50 no show fee. Phone number given for call back for any questions. Veronia Beets

## 2017-02-15 ENCOUNTER — Other Ambulatory Visit: Payer: Self-pay | Admitting: *Deleted

## 2017-02-15 ENCOUNTER — Telehealth (HOSPITAL_COMMUNITY): Payer: Self-pay | Admitting: Physician Assistant

## 2017-02-15 DIAGNOSIS — R0602 Shortness of breath: Secondary | ICD-10-CM

## 2017-02-16 ENCOUNTER — Ambulatory Visit (HOSPITAL_COMMUNITY): Payer: BLUE CROSS/BLUE SHIELD | Attending: Cardiology

## 2017-02-16 ENCOUNTER — Other Ambulatory Visit (HOSPITAL_COMMUNITY): Payer: BLUE CROSS/BLUE SHIELD

## 2017-02-16 ENCOUNTER — Other Ambulatory Visit: Payer: Self-pay

## 2017-02-16 DIAGNOSIS — Z8249 Family history of ischemic heart disease and other diseases of the circulatory system: Secondary | ICD-10-CM | POA: Diagnosis not present

## 2017-02-16 DIAGNOSIS — R0602 Shortness of breath: Secondary | ICD-10-CM

## 2017-02-16 DIAGNOSIS — I340 Nonrheumatic mitral (valve) insufficiency: Secondary | ICD-10-CM | POA: Insufficient documentation

## 2017-02-16 DIAGNOSIS — R079 Chest pain, unspecified: Secondary | ICD-10-CM | POA: Insufficient documentation

## 2017-02-16 NOTE — Telephone Encounter (Signed)
User: [name] A Date/time: 02/15/17 3:34 PM  Comment: Called pt and left her a VM asking her to CB .Marland Kitchenher stress echo appt will be rescheduled due to new protocol  Context:  Outcome: Left Message  Phone number: [phone] Phone Type: Home Phone  Comm. type: Telephone Call type: Outgoing  Contact: Carrie [name], Carrie [name] Relation to patient: Self

## 2017-03-11 ENCOUNTER — Telehealth (HOSPITAL_COMMUNITY): Payer: Self-pay | Admitting: *Deleted

## 2017-03-11 NOTE — Telephone Encounter (Signed)
Left message on voicemail per DPR in reference to upcoming appointment scheduled on 03/18/17 at 2:30 with detailed instructions given per Stress Test Requisition Sheet for the test. LM to arrive 30 minutes early, and that it is imperative to arrive on time for appointment to keep from having the test rescheduled. If you need to cancel or reschedule your appointment, please call the office within 24 hours of your appointment. Failure to do so may result in a cancellation of your appointment, and a $50 no show fee. Phone number given for call back for any questions. Veronia Beets

## 2017-03-18 ENCOUNTER — Other Ambulatory Visit (HOSPITAL_COMMUNITY): Payer: BLUE CROSS/BLUE SHIELD

## 2017-03-30 DIAGNOSIS — Z853 Personal history of malignant neoplasm of breast: Secondary | ICD-10-CM | POA: Diagnosis not present

## 2017-03-30 DIAGNOSIS — Z1231 Encounter for screening mammogram for malignant neoplasm of breast: Secondary | ICD-10-CM | POA: Diagnosis not present

## 2017-04-01 ENCOUNTER — Telehealth (HOSPITAL_COMMUNITY): Payer: Self-pay | Admitting: Physician Assistant

## 2017-04-09 NOTE — Telephone Encounter (Signed)
User: [name] A Date/time: 04/09/17 2:29 PM  Comment: Called pt and lmsg for her to CB to get r/s for stress echo.   Context:  Outcome: Left Message  Phone number: [phone] Phone Type: Home Phone  Comm. type: Telephone Call type: Outgoing  Contact: Jarnagin, Ashlen D Relation to patient: Self    User: [name] A Date/time: 04/02/17 3:34 PM  Comment: Called pt and lmsg for her to CB to get scheduled for a stress echo that was cancelled.   Context:  Outcome: Left Message  Phone number: [phone] Phone Type: Home Phone  Comm. type: Telephone Call type: Outgoing  Contact: Uram, Shaneka D Relation to patient: Self    User: [name] A Date/time: 04/01/17 9:58 AM  Comment: I returned the patient call and lmsg for her to CB to r/s stress echo.   Context:  Outcome: Left Message  Phone number: [phone] Phone Type: Home Phone  Comm. type: Telephone Call type: Outgoing  Contact: Fiala, Durga D Relation to patient: Self

## 2017-04-23 DIAGNOSIS — H2513 Age-related nuclear cataract, bilateral: Secondary | ICD-10-CM | POA: Diagnosis not present

## 2017-04-23 DIAGNOSIS — H04123 Dry eye syndrome of bilateral lacrimal glands: Secondary | ICD-10-CM | POA: Diagnosis not present

## 2017-04-23 DIAGNOSIS — E119 Type 2 diabetes mellitus without complications: Secondary | ICD-10-CM | POA: Diagnosis not present

## 2017-06-30 DIAGNOSIS — Z01419 Encounter for gynecological examination (general) (routine) without abnormal findings: Secondary | ICD-10-CM | POA: Diagnosis not present

## 2017-06-30 DIAGNOSIS — R350 Frequency of micturition: Secondary | ICD-10-CM | POA: Diagnosis not present

## 2017-06-30 DIAGNOSIS — N393 Stress incontinence (female) (male): Secondary | ICD-10-CM | POA: Diagnosis not present

## 2017-06-30 DIAGNOSIS — Z6827 Body mass index (BMI) 27.0-27.9, adult: Secondary | ICD-10-CM | POA: Diagnosis not present

## 2017-06-30 DIAGNOSIS — R87610 Atypical squamous cells of undetermined significance on cytologic smear of cervix (ASC-US): Secondary | ICD-10-CM | POA: Diagnosis not present

## 2017-07-26 DIAGNOSIS — R87611 Atypical squamous cells cannot exclude high grade squamous intraepithelial lesion on cytologic smear of cervix (ASC-H): Secondary | ICD-10-CM | POA: Diagnosis not present

## 2017-07-27 ENCOUNTER — Telehealth: Payer: Self-pay | Admitting: Physician Assistant

## 2017-07-27 NOTE — Telephone Encounter (Signed)
New message    Patient calling to schedule stress echo. New order needed. Please advise

## 2017-08-30 ENCOUNTER — Ambulatory Visit: Payer: Self-pay | Admitting: Medical

## 2017-08-30 ENCOUNTER — Ambulatory Visit
Admission: RE | Admit: 2017-08-30 | Discharge: 2017-08-30 | Disposition: A | Discharge status: Home / Self Care | Payer: BLUE CROSS/BLUE SHIELD | Source: Ambulatory Visit | Attending: Medical | Admitting: Medical

## 2017-08-30 VITALS — BP 159/66 | HR 73 | Temp 97.9°F | Resp 16 | Ht 74.0 in | Wt 211.8 lb

## 2017-08-30 DIAGNOSIS — K5900 Constipation, unspecified: Secondary | ICD-10-CM | POA: Diagnosis not present

## 2017-08-30 DIAGNOSIS — R1030 Lower abdominal pain, unspecified: Secondary | ICD-10-CM

## 2017-08-30 LAB — POCT URINALYSIS DIPSTICK
Bilirubin, UA: NEGATIVE
Blood, UA: NEGATIVE
Glucose, UA: NEGATIVE
Ketones, UA: NEGATIVE
LEUKOCYTES UA: NEGATIVE
NITRITE UA: NEGATIVE
PH UA: 7 (ref 5.0–8.0)
Protein, UA: NEGATIVE
Spec Grav, UA: <=1.005 — AB (ref 1.010–1.025)
UROBILINOGEN UA: 0.2 U/dL

## 2017-08-30 NOTE — Progress Notes (Signed)
Subjective:    Patient ID: Carrie [name], female    DOB: 01-11-1956, 62 y.o.   MRN: [id number]  HPI 62 yo female in non acute distress.  Two weeks ago with bloody stools "a little constipation at that time" History of hemorrhoids , no blood  on the tissue, blood was also in toilet water with bright red blood. History of bladder infection 4 weeks seen by OB/GYN and treated with antibiotics.  Over the last week with lower abdominal pain mostly on the right side but on both. Some bloating, bowel movement seemed normal  on Saturday am and then two more bowel movement on Saturday afternoon twice with pellets and Sunday with bowel movement pellets again.  Fiber powder yesterday.drinking more water but still feels discomfort and bloated. Has had gas both Saturday and Sunday. Having frequency of urination "but I am drinking more water per my doctor". " I got up  3 -4 times last night to urinate. Had 1.5 glasses this morning.   History of colonoscopy 1 year ago with polyps she is to return in 5 yrs from her last colonoscopy for recheck. Primary care doctor is Leighton Ruff.  Still has appendix and does not have gallbladder.   Review of Systems  Constitutional: Positive for appetite change (decreased appetitie) and fatigue. Negative for chills, diaphoresis and fever.  HENT: Positive for rhinorrhea ("allergies"). Negative for ear pain and sore throat.   Eyes: Positive for discharge ("my allergies" nothing more than normal). Negative for itching ("my allergies").  Respiratory: Negative for cough and wheezing.   Cardiovascular: Negative for chest pain.  Gastrointestinal: Positive for abdominal distention (feels full), abdominal pain, blood in stool and constipation. Negative for anal bleeding, diarrhea, nausea, rectal pain and vomiting.  Endocrine: Positive for polyuria ("drinking more water"). Negative for polydipsia and polyphagia.  Genitourinary: Positive for frequency and urgency (at night).  Negative for decreased urine volume, dysuria, flank pain, vaginal bleeding, vaginal discharge and vaginal pain.  Musculoskeletal: Negative for back pain and myalgias.  Skin: Negative for rash.  Allergic/Immunologic: Positive for environmental allergies. Negative for food allergies.  Neurological: Positive for light-headedness ("I attribute this to my sinus allergies too"). Negative for dizziness, syncope and headaches.  Hematological: Negative for adenopathy.  Psychiatric/Behavioral: Negative for behavioral problems, self-injury and suicidal ideas. The patient is not nervous/anxious.        Objective:   Physical Exam  Constitutional: She is oriented to person, place, and time. She appears well-developed and well-nourished.  HENT:  Head: Normocephalic and atraumatic.  Right Ear: External ear normal.  Left Ear: External ear normal.  Mouth/Throat: Oropharynx is clear and moist.  Eyes: Pupils are equal, round, and reactive to light. Conjunctivae and EOM are normal.  Neck: Normal range of motion. Neck supple.  Cardiovascular: Normal rate, regular rhythm and normal heart sounds.  Pulmonary/Chest: Effort normal and breath sounds normal.  Abdominal: Soft. Normal appearance and bowel sounds are normal. There is no hepatosplenomegaly. There is tenderness (lower bilaterally with palpation) in the right lower quadrant and left lower quadrant. There is rebound. There is no rigidity, no guarding, no tenderness at McBurney's point and negative [name]'s sign.    Genitourinary: Rectal exam shows guaiac negative stool.  Neurological: She is oriented to person, place, and time.  Skin: Skin is warm and dry.  Psychiatric: She has a normal mood and affect. Her behavior is normal. Judgment and thought content normal.  Nursing note and vitals reviewed. Easily gets up from the chair onto  exam table, easily lies down supine on exam table, no guardingt or grimacing with palpation of abdomen.  Urine dip  wnl no  sign of infection no hematuria. Will send off for culture. Medium yellow color appears concentrated.  Lower abdominal tenderness, no guarding or mass, no rebound tenderness, soft and good BS all 4 quadrants  Rectal exam small hemerrhoid at 6 O'Clock not inflammed. Mild suprapubic pain  guaiac negative  Assessment & Plan:  Abdominal pain Constipation and bloating KUB STAT CBC w/ diff , Met C TSH STAT To increase water intake till urine is a light yellow. OTC Miralax, take as directed. .  Will call patient tonight with labs/ x-ray results. Reviewed with patient to follow up with the Emergency Department if pain worsens, fever 100.5,  or higher, vomiting. Patient verbalizes understanding and has no questions at discharge.  Called patient 8:30pm feeling some better after Miralax. Though no BM yet. But feels less uncomfortable. Reviewed  KUB EXAM:Findings of x-ray.  ABDOMEN - 1 VIEW  COMPARISON:  05/07/2016  FINDINGS: There is a small to moderate colonic stool burden which is slightly greater than on the prior study. There is mild gaseous distension of the transverse colon. No dilated bowel loops suggestive of obstruction are identified. Right upper quadrant surgical clips are noted. A rounded calcification in the pelvis is unchanged and likely represents a fibroid. No acute osseous abnormality is seen.  IMPRESSION: Nonobstructed bowel gas pattern. Mildly increased colonic stool burden from prior.  Electronically Signed   By: Logan Bores M.D.   On: 08/30/2017 15:48 Will call patient in am with blood work results.She is to contact her GI doctor about blood in stool.

## 2017-08-30 NOTE — Patient Instructions (Signed)
Abdominal Bloating °When you have abdominal bloating, your abdomen may feel full, tight, or painful. It may also look bigger than normal or swollen (distended). Common causes of abdominal bloating include: °· Swallowing air. °· Constipation. °· Problems digesting food. °· Eating too much. °· Irritable bowel syndrome. This is a condition that affects the large intestine. °· Lactose intolerance. This is an inability to digest lactose, a natural sugar in dairy products. °· Celiac disease. This is a condition that affects the ability to digest gluten, a protein found in some grains. °· Gastroparesis. This is a condition that slows down the movement of food in the stomach and small intestine. It is more common in people with diabetes mellitus. °· Gastroesophageal reflux disease (GERD). This is a digestive condition that makes stomach acid flow back into the esophagus. °· Urinary retention. This means that the body is holding onto urine, and the bladder cannot be emptied all the way. ° °Follow these instructions at home: °Eating and drinking °· Avoid eating too much. °· Try not to swallow air while talking or eating. °· Avoid eating while lying down. °· Avoid these foods and drinks: °? Foods that cause gas, such as broccoli, cabbage, cauliflower, and baked beans. °? Carbonated drinks. °? Hard candy. °? Chewing gum. °Medicines °· Take over-the-counter and prescription medicines only as told by your health care provider. °· Take probiotic medicines. These medicines contain live bacteria or yeasts that can help digestion. °· Take coated peppermint oil capsules. °Activity °· Try to exercise regularly. Exercise may help to relieve bloating that is caused by gas and relieve constipation. °General instructions °· Keep all follow-up visits as told by your health care provider. This is important. °Contact a health care provider if: °· You have nausea and vomiting. °· You have diarrhea. °· You have abdominal pain. °· You have  unusual weight loss or weight gain. °· You have severe pain, and medicines do not help. °Get help right away if: °· You have severe chest pain. °· You have trouble breathing. °· You have shortness of breath. °· You have trouble urinating. °· You have darker urine than normal. °· You have blood in your stools or have dark, tarry stools. °Summary °· Abdominal bloating means that the abdomen is swollen. °· Common causes of abdominal bloating are swallowing air, constipation, and problems digesting food. °· Avoid eating too much and avoid swallowing air. °· Avoid foods that cause gas, carbonated drinks, hard candy, and chewing gum. °This information is not intended to replace advice given to you by your health care provider. Make sure you discuss any questions you have with your health care provider. °Document Released: 06/12/2016 Document Revised: 06/12/2016 Document Reviewed: 06/12/2016 °Elsevier Interactive Patient Education © 2018 [employer] ° °Constipation, Adult °Constipation is when a person: °· Poops (has a bowel movement) fewer times in a week than normal. °· Has a hard time pooping. °· Has poop that is dry, hard, or bigger than normal. ° °Follow these instructions at home: °Eating and drinking ° °· Eat foods that have a lot of fiber, such as: °? Fresh fruits and vegetables. °? Whole grains. °? Beans. °· Eat less of foods that are high in fat, low in fiber, or overly processed, such as: °? French fries. °? Hamburgers. °? Cookies. °? Candy. °? Soda. °· Drink enough fluid to keep your pee (urine) clear or pale yellow. °General instructions °· Exercise regularly or as told by your doctor. °· Go to the restroom when you   feel like you need to poop. Do not hold it in. °· Take over-the-counter and prescription medicines only as told by your doctor. These include any fiber supplements. °· Do pelvic floor retraining exercises, such as: °? Doing deep breathing while relaxing your lower belly (abdomen). °? Relaxing  your pelvic floor while pooping. °· Watch your condition for any changes. °· Keep all follow-up visits as told by your doctor. This is important. °Contact a doctor if: °· You have pain that gets worse. °· You have a fever. °· You have not pooped for 4 days. °· You throw up (vomit). °· You are not hungry. °· You lose weight. °· You are bleeding from the anus. °· You have thin, pencil-like poop (stool). °Get help right away if: °· You have a fever, and your symptoms suddenly get worse. °· You leak poop or have blood in your poop. °· Your belly feels hard or bigger than normal (is bloated). °· You have very bad belly pain. °· You feel dizzy or you faint. °This information is not intended to replace advice given to you by your health care provider. Make sure you discuss any questions you have with your health care provider. °Document Released: 10/28/2007 Document Revised: 11/29/2015 Document Reviewed: 10/30/2015 °Elsevier Interactive Patient Education © 2018 [employer]. ° °

## 2017-08-31 ENCOUNTER — Telehealth: Payer: Self-pay | Admitting: Medical

## 2017-08-31 DIAGNOSIS — R7402 Elevation of levels of lactic acid dehydrogenase (LDH): Secondary | ICD-10-CM

## 2017-08-31 DIAGNOSIS — R17 Unspecified jaundice: Secondary | ICD-10-CM

## 2017-08-31 DIAGNOSIS — R740 Nonspecific elevation of levels of transaminase and lactic acid dehydrogenase [LDH]: Principal | ICD-10-CM

## 2017-08-31 LAB — CMP12+LP+TP+TSH+6AC+CBC/D/PLT
A/G RATIO: 2.6 — AB (ref 1.2–2.2)
ALBUMIN: 5.2 g/dL — AB (ref 3.6–4.8)
ALK PHOS: 67 IU/L (ref 39–117)
ALT: 17 IU/L (ref 0–32)
AST: 22 IU/L (ref 0–40)
BASOS ABS: 0 10*3/uL (ref 0.0–0.2)
BASOS: 0 %
BILIRUBIN TOTAL: 4.1 mg/dL — AB (ref 0.0–1.2)
BUN/Creatinine Ratio: 10 — ABNORMAL LOW (ref 12–28)
BUN: 8 mg/dL (ref 8–27)
CHOLESTEROL TOTAL: 127 mg/dL (ref 100–199)
Calcium: 9.3 mg/dL (ref 8.7–10.3)
Chloride: 104 mmol/L (ref 96–106)
Chol/HDL Ratio: 2.9 ratio (ref 0.0–4.4)
Creatinine, Ser: 0.84 mg/dL (ref 0.57–1.00)
EOS (ABSOLUTE): 0.1 10*3/uL (ref 0.0–0.4)
EOS: 2 %
Estimated CHD Risk: <0.5 times avg. (ref 0.0–1.0)
Free Thyroxine Index: 2.1 (ref 1.2–4.9)
GFR calc Af Amer: 86 mL/min/{1.73_m2} (ref >59)
GFR calc non Af Amer: 75 mL/min/{1.73_m2} (ref >59)
GGT: 22 IU/L (ref 0–60)
GLOBULIN, TOTAL: 2 g/dL (ref 1.5–4.5)
Glucose: 128 mg/dL — ABNORMAL HIGH (ref 65–99)
HDL: 44 mg/dL (ref >39)
HEMATOCRIT: 32 % — AB (ref 34.0–46.6)
HEMOGLOBIN: 11 g/dL — AB (ref 11.1–15.9)
IMMATURE GRANS (ABS): 0 10*3/uL (ref 0.0–0.1)
IRON: 86 ug/dL (ref 27–139)
Immature Granulocytes: 1 %
LDH: 236 IU/L — ABNORMAL HIGH (ref 119–226)
LDL CALC: 61 mg/dL (ref 0–99)
LYMPHS ABS: 1.1 10*3/uL (ref 0.7–3.1)
LYMPHS: 28 %
MCH: 27.2 pg (ref 26.6–33.0)
MCHC: 34.4 g/dL (ref 31.5–35.7)
MCV: 79 fL (ref 79–97)
MONOS ABS: 0.3 10*3/uL (ref 0.1–0.9)
Monocytes: 8 %
NEUTROS PCT: 61 %
Neutrophils Absolute: 2.4 10*3/uL (ref 1.4–7.0)
PLATELETS: 212 10*3/uL (ref 150–379)
Phosphorus: 3.5 mg/dL (ref 2.5–4.5)
Potassium: 3.9 mmol/L (ref 3.5–5.2)
RBC: 4.05 x10E6/uL (ref 3.77–5.28)
RDW: 17.7 % — ABNORMAL HIGH (ref 12.3–15.4)
Sodium: 141 mmol/L (ref 134–144)
T3 UPTAKE RATIO: 25 % (ref 24–39)
T4, Total: 8.4 ug/dL (ref 4.5–12.0)
TOTAL PROTEIN: 7.2 g/dL (ref 6.0–8.5)
TRIGLYCERIDES: 111 mg/dL (ref 0–149)
TSH: 0.896 u[IU]/mL (ref 0.450–4.500)
Uric Acid: 5.1 mg/dL (ref 2.5–7.1)
VLDL CHOLESTEROL CAL: 22 mg/dL (ref 5–40)
WBC: 3.8 10*3/uL (ref 3.4–10.8)

## 2017-08-31 NOTE — Telephone Encounter (Signed)
Discussed labs with Dr. Rosanna Randy recheck Total Bili tomorrow . Called patient on labs she has spherocytosis anemia which causes her total bilirubin to be elevated. She is supposed to be taking folic acid but is not.  We will repeat labs tomorrow to monitor and then she is to follow up with her primary doctor.  And her GI doctor for blood in stool.  Patient feeling better today then yesterday.  Patient verbalizes understanding and has no questions a the end of our conversation.  Orders placed in EPIC

## 2017-09-01 ENCOUNTER — Other Ambulatory Visit: Payer: Self-pay

## 2017-09-01 DIAGNOSIS — R7402 Elevation of levels of lactic acid dehydrogenase (LDH): Secondary | ICD-10-CM

## 2017-09-01 DIAGNOSIS — R740 Nonspecific elevation of levels of transaminase and lactic acid dehydrogenase [LDH]: Principal | ICD-10-CM

## 2017-09-01 LAB — URINE CULTURE

## 2017-09-02 LAB — SPECIMEN STATUS

## 2017-09-03 LAB — HEPATIC FUNCTION PANEL
ALBUMIN: 4.9 g/dL — AB (ref 3.6–4.8)
ALT: 22 IU/L (ref 0–32)
AST: 25 IU/L (ref 0–40)
Alkaline Phosphatase: 73 IU/L (ref 39–117)
Bilirubin Total: 4.4 mg/dL — ABNORMAL HIGH (ref 0.0–1.2)
Bilirubin, Direct: 0.37 mg/dL (ref 0.00–0.40)
Total Protein: 6.9 g/dL (ref 6.0–8.5)

## 2017-09-03 LAB — LACTATE DEHYDROGENASE, ISOENZYMES
(LD) FRACTION 3: 16 % — AB (ref 17–27)
(LD) Fraction 1: 36 % — ABNORMAL HIGH (ref 17–32)
(LD) Fraction 2: 37 % (ref 25–40)
(LD) Fraction 4: 5 % (ref 5–13)
(LD) Fraction 5: 6 % (ref 4–20)
LDH: 262 IU/L — ABNORMAL HIGH (ref 119–226)

## 2017-09-07 ENCOUNTER — Telehealth: Payer: Self-pay | Admitting: Medical

## 2017-09-07 NOTE — Telephone Encounter (Signed)
Returned my phone call.  Reviewed labs with patient Still elelvated T. Bili and LDH.  She would like her labs sent to [facility] [phone]) sent to Dr. Juanita Craver her GI doctor..She is planning on setting up an appointment with the clinic. Permission by phone from patient to send labs to her doctor.  She reports the Miralax has helped her a lot and is in no pain now.  Feeling well.  Labs faxed to [phone], they are not on Epic.

## 2017-09-07 NOTE — Telephone Encounter (Signed)
Called cell and left message to review labs with patient.

## 2017-09-07 NOTE — Telephone Encounter (Signed)
Done

## 2017-09-08 NOTE — Telephone Encounter (Signed)
Labs from 08/30/17 faxed to [phone] Dr.Jyothi Mann as requested.

## 2017-09-28 DIAGNOSIS — R194 Change in bowel habit: Secondary | ICD-10-CM | POA: Diagnosis not present

## 2017-09-28 DIAGNOSIS — K5730 Diverticulosis of large intestine without perforation or abscess without bleeding: Secondary | ICD-10-CM | POA: Diagnosis not present

## 2017-09-28 DIAGNOSIS — K625 Hemorrhage of anus and rectum: Secondary | ICD-10-CM | POA: Diagnosis not present

## 2017-09-28 DIAGNOSIS — K641 Second degree hemorrhoids: Secondary | ICD-10-CM | POA: Diagnosis not present

## 2017-10-25 ENCOUNTER — Ambulatory Visit: Payer: Self-pay | Admitting: Medical

## 2017-10-25 VITALS — BP 155/74 | HR 72 | Temp 98.0°F | Resp 16 | Wt 212.8 lb

## 2017-10-25 DIAGNOSIS — J301 Allergic rhinitis due to pollen: Secondary | ICD-10-CM

## 2017-10-25 DIAGNOSIS — Z9109 Other allergy status, other than to drugs and biological substances: Secondary | ICD-10-CM

## 2017-10-25 DIAGNOSIS — J0100 Acute maxillary sinusitis, unspecified: Secondary | ICD-10-CM

## 2017-10-26 ENCOUNTER — Encounter: Payer: Self-pay | Admitting: Medical

## 2017-11-01 ENCOUNTER — Ambulatory Visit (INDEPENDENT_AMBULATORY_CARE_PROVIDER_SITE_OTHER): Payer: BLUE CROSS/BLUE SHIELD

## 2017-11-01 ENCOUNTER — Encounter (HOSPITAL_COMMUNITY): Payer: Self-pay | Admitting: Emergency Medicine

## 2017-11-01 ENCOUNTER — Ambulatory Visit (HOSPITAL_COMMUNITY)
Admission: EM | Admit: 2017-11-01 | Discharge: 2017-11-01 | Disposition: A | Discharge status: Home / Self Care | Payer: BLUE CROSS/BLUE SHIELD | Attending: Family Medicine | Admitting: Family Medicine

## 2017-11-01 ENCOUNTER — Other Ambulatory Visit: Payer: Self-pay

## 2017-11-01 DIAGNOSIS — R0789 Other chest pain: Secondary | ICD-10-CM

## 2017-11-01 MED ORDER — GI COCKTAIL ~~LOC~~
30.0000 mL | Freq: Once | ORAL | Status: AC
  Administered 2017-11-01: 30 mL via ORAL

## 2017-11-01 MED ORDER — GI COCKTAIL ~~LOC~~
ORAL | Status: AC
  Filled 2017-11-01: qty 30

## 2017-11-01 NOTE — ED Triage Notes (Signed)
Patient has a heaviness on left chest, left back and intermittent tightness.  Sensation is making her anxious.  No nausea, no vomiting, no sob.    Patient describes a similar episode with medication in the past.  Has started a new med one week ago and has been on amoxicillin for a sinus infection.  Patient had a bm today that did seem to help symptoms slightly.

## 2017-11-01 NOTE — ED Provider Notes (Signed)
[facility]    CSN: [id number] Arrival date & time: 11/01/17  1116     History   Chief Complaint Chief Complaint  Patient presents with  . Chest Pain    HPI Carrie [name] is a 62 y.o. female history of GERD, hyperlipidemia presenting today for evaluation of chest pain.  Patient states that she began to have a heaviness in her left chest last night, discomfort radiates into her back as well as left arm.  Denies numbness or tingling.  Denies associated shortness of breath or nausea or vomiting.  Patient does state that discomfort worsens when she takes a deep breath.  Denies coughing or any coughing with her sinus infection.  Has also noticed that she has been burping more recently.  2 weeks ago she began taking Linzess, 1 week ago she began taking amoxicillin for sinus infection.  Feels like her symptoms worsen also when she takes a dose of the amoxicillin.  Had 2 bowel movements earlier today that seemed to help the symptoms slightly.  Denies history of hypertension, DM type II, smoking.  Patient does have significant family history positive for death from MI-both parents and grandparents ranging from 5s to 35s.  HPI  Past Medical History:  Diagnosis Date  . Allergy   . Anemia    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  . Anemia   . Arthritis    back  . Boil, breast   . Bronchitis 3+yrs ago  . Chronic back pain    hx of;pt lost weight and does yoga and back pain gone  . Complication of anesthesia    increased Heart Rate after knee surgery  . Cyst 1976   left arm   . Eczema   . 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 Fibroids 2009   embolization of fibroids  . GERD (gastroesophageal reflux disease)    takes Omeprazole daily prn, history of  . Headache   . Heart murmur   . History of bladder infections 25+yrs ago   used to see a urologist  . History of IBS   . Hyperlipidemia   . Hypothyroidism    hx of 30+yrs ago;was on  meds x couple of months and has been fine since  . Knee pain   . Pre-diabetes   . Seasonal allergies    takes Allegra as needed as well as using Flonase  . Vertigo     Patient Active Problem List   Diagnosis Date Noted  . DOE (dyspnea on exertion) 01/14/2017  . Headache 12/17/2016  . Blood glucose elevated 12/17/2016  . Encounter for screening for lipoid disorders 12/17/2016  . Spherocytosis ([place]) 12/17/2016  . Vitamin D deficiency 12/17/2016  . Elevated bilirubin 01/09/2015  . Pain in left shoulder 01/09/2015  . Symptomatic cholelithiasis 04/14/2011  . Anemia 04/14/2011    Past Surgical History:  Procedure Laterality Date  . CHOLECYSTECTOMY  05/08/2011   Procedure: LAPAROSCOPIC CHOLECYSTECTOMY WITH INTRAOPERATIVE CHOLANGIOGRAM;  Surgeon: Harl Bowie, MD;  Location: [facility];  Service: General;  Laterality: N/A;  LAPAROSCOPIC CHOLECYSTECTOMY WITH INTRAOPERATIVE CHOLANGIOGRAM  . CHOLECYSTECTOMY    . COLONOSCOPY    . DILATATION & CURETTAGE/HYSTEROSCOPY WITH MYOSURE N/A 02/18/2016   Procedure: DILATATION & CURETTAGE/HYSTEROSCOPY WITH MYOSURE;  Surgeon: Servando Salina, MD;  Location: [facility];  Service: Gynecology;  Laterality: N/A;  . ENDOMETRIAL ABLATION  2011  . KNEE SURGERY  15+yrs ago   left knee  . KNEE SURGERY    .  TONSILLECTOMY     and adenoids as child  . TUBAL LIGATION  1988    OB History   None      Home Medications    Prior to Admission medications   Medication Sig Start Date End Date Taking? Authorizing Provider  amoxicillin (AMOXIL) 875 MG tablet Take 875 mg by mouth 2 (two) times daily.   Yes [provider]  cetirizine (ZYRTEC) 10 MG tablet Take 10 mg by mouth daily.   Yes [provider]  linaclotide (LINZESS) 72 MCG capsule Take 72 mcg by mouth daily before breakfast.   Yes [provider]  Ascorbic Acid (VITAMIN C) 100 MG tablet Take 100 mg by mouth daily.      [provider]  Calcium-Magnesium-Vitamin D  (CALCIUM MAGNESIUM PO) Take 1 tablet by mouth 2 (two) times a week.    [provider]  cholecalciferol (VITAMIN D) 1000 UNITS tablet Take 1,000 Units by mouth daily.      [provider]  fluticasone (FLONASE) 50 MCG/ACT nasal spray Place 2 sprays into the nose daily as needed for rhinitis.     [provider]  FOLIC ACID PO Take 1 tablet by mouth daily.    [provider]  GLUCOSAMINE HCL PO Take 1 tablet by mouth 3 (three) times a week.    [provider]  Multiple Vitamin (MULTIVITAMIN) tablet Take 1 tablet by mouth daily.    [provider]  Probiotic Product (PROBIOTIC ADVANCED PO) Take 1 capsule by mouth 3 (three) times a week.     [provider]  psyllium (METAMUCIL) 58.6 % powder Take 1 packet by mouth 3 (three) times daily.    [provider]  Turmeric POWD by Does not apply route. 1 teaspoon 3 times weekly    [provider]  Vitamin D, Ergocalciferol, (DRISDOL) 50000 units CAPS capsule Take 1 capsule by mouth once a week. (For 8 weeks) 12/22/16   [provider]    Family History Family History  Problem Relation Age of Onset  . CAD Mother   . Hereditary spherocytosis Mother   . Diabetes Mother   . CVA Mother   . Diabetes Father   . CAD Father   . Cerebral palsy Sister   . Breast cancer Paternal Aunt   . Hereditary spherocytosis Child   . Anesthesia problems Neg Hx   . Hypotension Neg Hx   . Malignant hyperthermia Neg Hx   . Pseudochol deficiency Neg Hx     Social History Social History   Tobacco Use  . Smoking status: Never Smoker  . Smokeless tobacco: Never Used  Substance Use Topics  . Alcohol use: Yes    Comment:  wine once a week  . Drug use: No     Allergies   Anesthetics, amide; Celebrex [celecoxib]; and Celecoxib   Review of Systems Review of Systems  Constitutional: Negative for activity change, appetite change, fatigue and fever.  Respiratory: Positive for  chest tightness. Negative for cough and shortness of breath.   Cardiovascular: Positive for chest pain. Negative for leg swelling.  Gastrointestinal: Negative for abdominal pain, diarrhea, nausea and vomiting.  Musculoskeletal: Negative for back pain and myalgias.  Skin: Negative for rash.  Neurological: Positive for light-headedness. Negative for dizziness, speech difficulty, weakness, numbness and headaches.     Physical Exam Triage Vital Signs ED Triage Vitals  Enc Vitals Group     BP 11/01/17 1126 137/65     Pulse Rate  11/01/17 1126 76     Resp 11/01/17 1126 20     Temp 11/01/17 1126 98.3 F (36.8 C)     Temp Source 11/01/17 1126 Oral     SpO2 11/01/17 1126 99 %     Weight --      Height --      Head Circumference --      Peak Flow --      Pain Score 11/01/17 1123 6     Pain Loc --      Pain Edu? --      Excl. in [place]? --    No data found.  Updated Vital Signs BP 137/65 (BP Location: Left Arm)   Pulse 76   Temp 98.3 F (36.8 C) (Oral)   Resp 20   SpO2 99%   Visual Acuity Right Eye Distance:   Left Eye Distance:   Bilateral Distance:    Right Eye Near:   Left Eye Near:    Bilateral Near:     Physical Exam  Constitutional: She is oriented to person, place, and time. She appears well-developed and well-nourished. No distress.  HENT:  Head: Normocephalic and atraumatic.  Mouth/Throat: Oropharynx is clear and moist.  Eyes: Pupils are equal, round, and reactive to light. Conjunctivae are normal.  Neck: Neck supple.  Cardiovascular: Normal rate and regular rhythm.  No murmur heard. Pulmonary/Chest: Effort normal and breath sounds normal. No respiratory distress.  Deep inspiration elicited worsening chest discomfort  Chest nontender to palpation, not reproducible  Abdominal: Soft. There is no tenderness.  Bowel sounds present throughout all 4 quadrants, nontender to palpation throughout all 4 quadrants and epigastrium, negative rebound, negative [name]'s.    Musculoskeletal: She exhibits no edema.  Neurological: She is alert and oriented to person, place, and time. No cranial nerve deficit.  Skin: Skin is warm and dry.  Psychiatric: She has a normal mood and affect.  Nursing note and vitals reviewed.    UC Treatments / Results  Labs (all labs ordered are listed, but only abnormal results are displayed) Labs Reviewed - No data to display  EKG None  Radiology Dg Chest 2 View  Result Date: 11/01/2017 CLINICAL DATA:  Chest discomfort with deep breath, anxiety EXAM: CHEST - 2 VIEW COMPARISON:  Chest x-ray of 11/02/2014 FINDINGS: No active infiltrate or effusion is seen. Mediastinal and hilar contours are unremarkable. The heart is within normal limits in size. No acute bony abnormality is seen. IMPRESSION: No active cardiopulmonary disease. Electronically Signed   By: Ivar Drape M.D.   On: 11/01/2017 12:06    Procedures Procedures (including critical care time)  Medications Ordered in UC Medications  gi cocktail (Maalox,Lidocaine,Donnatal) (30 mLs Oral Given 11/01/17 1229)    Initial Impression / Assessment and Plan / UC Course  I have reviewed the triage vital signs and the nursing notes.  Pertinent labs & imaging results that were available during my care of the patient were reviewed by me and considered in my medical decision making (see chart for details).     EKG normal sinus rhythm, no acute signs of ischemia or infarction.  Chest x-ray negative.  Improvement in discomfort with GI cocktail.  Symptoms seem mainly GI related.  Will outpatient follow-up with gastroenterology, continue taking medicines previously prescribed by them.  Discussed following up with PCP for outpatient evaluation of cardiac risk.Discussed strict return precautions. Patient verbalized understanding and is agreeable with plan.  Final Clinical Impressions(s) / UC Diagnoses   Final diagnoses:  Atypical chest pain     Discharge Instructions     Chest  discomfort most likely related to reflux symptoms  Please continue medicines prescribed by gastroenterology  If symptoms worsening, developing shortness of breath, nausea, dizziness, numbness, symptoms worsening with walking please go to emergency room.  Follow up with your primary care for further cardiac workup.    ED Prescriptions    None     Controlled Substance Prescriptions [place] Controlled Substance Registry consulted? Not Applicable   Janith Lima, [place] 11/01/17 1715

## 2017-11-01 NOTE — Discharge Instructions (Signed)
Chest discomfort most likely related to reflux symptoms  Please continue medicines prescribed by gastroenterology  If symptoms worsening, developing shortness of breath, nausea, dizziness, numbness, symptoms worsening with walking please go to emergency room.  Follow up with your primary care for further cardiac workup.

## 2017-11-02 DIAGNOSIS — R140 Abdominal distension (gaseous): Secondary | ICD-10-CM | POA: Diagnosis not present

## 2017-11-02 DIAGNOSIS — K5730 Diverticulosis of large intestine without perforation or abscess without bleeding: Secondary | ICD-10-CM | POA: Diagnosis not present

## 2017-11-02 DIAGNOSIS — K5904 Chronic idiopathic constipation: Secondary | ICD-10-CM | POA: Diagnosis not present

## 2017-11-02 DIAGNOSIS — K219 Gastro-esophageal reflux disease without esophagitis: Secondary | ICD-10-CM | POA: Diagnosis not present

## 2017-11-02 NOTE — Progress Notes (Signed)
   Subjective:    Patient ID: Carrie [name], female    DOB: 11/14/1955, 62 y.o.   MRN: [id number]  HPI 62 yo non acute distress. Complains of sinus pressure on the right side for one week . No fever or chills no cough . Deniews shortness of breath and chest pain. No nausea or diarrhea, soft stools.  Out of Flonase x 2 months.     Blood pressure (!) 155/74, pulse 72, temperature 98 F (36.7 C), temperature source Tympanic, resp. rate 16, weight 212 lb 12.8 oz (96.5 kg), SpO2 100 %.  Review of Systems  Constitutional: Negative for chills and fever.  HENT: Positive for sinus pressure (sinus pressure right side) and sinus pain (right side). Negative for sore throat.   Respiratory: Negative for cough.   Cardiovascular: Negative for chest pain.  no N/V but soft stools     Objective:   Physical Exam  Constitutional: She is oriented to person, place, and time. She appears well-developed and well-nourished.  HENT:  Head: Normocephalic and atraumatic.  Right Ear: Hearing, external ear and ear canal normal. A middle ear effusion is present.  Left Ear: Hearing, external ear and ear canal normal. A middle ear effusion is present.  Nose: Rhinorrhea (clear) present. Right sinus exhibits maxillary sinus tenderness and frontal sinus tenderness. Left sinus exhibits no maxillary sinus tenderness and no frontal sinus tenderness.  Mouth/Throat: Oropharynx is clear and moist. No oropharyngeal exudate.  Eyes: Pupils are equal, round, and reactive to light. Conjunctivae and EOM are normal.  Neck: Normal range of motion. Neck supple.  Cardiovascular: Normal rate, regular rhythm and normal heart sounds.  Pulmonary/Chest: Effort normal and breath sounds normal.  Lymphadenopathy:    She has no cervical adenopathy.  Neurological: She is alert and oriented to person, place, and time.  Skin: Skin is warm and dry.  Psychiatric: She has a normal mood and affect. Her behavior is normal. Judgment and thought  content normal.  Nursing note and vitals reviewed.         Assessment & Plan:  Computer network not working: done by hand and entered at a later date.  Sinusitis Eutstachian tube dysfunction  Augmentin  875-125mg  one po bid  X 10 days  #20 no rx with food. Flonase  2 sprays each nare qd#1 10 rx Zyrtec 10mg  one po qd #30 10 refills.  Patient verbalizes understanding and has no questions at discharge.

## 2017-12-29 ENCOUNTER — Other Ambulatory Visit: Payer: Self-pay | Admitting: Family Medicine

## 2017-12-29 DIAGNOSIS — E042 Nontoxic multinodular goiter: Secondary | ICD-10-CM

## 2018-03-31 DIAGNOSIS — Z803 Family history of malignant neoplasm of breast: Secondary | ICD-10-CM | POA: Diagnosis not present

## 2018-03-31 DIAGNOSIS — Z1231 Encounter for screening mammogram for malignant neoplasm of breast: Secondary | ICD-10-CM | POA: Diagnosis not present

## 2018-04-11 DIAGNOSIS — R7303 Prediabetes: Secondary | ICD-10-CM | POA: Diagnosis not present

## 2018-04-11 DIAGNOSIS — E042 Nontoxic multinodular goiter: Secondary | ICD-10-CM | POA: Diagnosis not present

## 2018-04-11 DIAGNOSIS — D580 Hereditary spherocytosis: Secondary | ICD-10-CM | POA: Diagnosis not present

## 2018-04-11 DIAGNOSIS — E559 Vitamin D deficiency, unspecified: Secondary | ICD-10-CM | POA: Diagnosis not present

## 2018-04-13 ENCOUNTER — Other Ambulatory Visit: Payer: Self-pay | Admitting: Family Medicine

## 2018-04-13 DIAGNOSIS — E042 Nontoxic multinodular goiter: Secondary | ICD-10-CM

## 2018-05-27 ENCOUNTER — Encounter: Payer: Self-pay | Admitting: Adult Health

## 2018-05-27 ENCOUNTER — Ambulatory Visit: Payer: Self-pay | Admitting: Adult Health

## 2018-05-27 VITALS — BP 144/66 | HR 70 | Temp 98.1°F | Resp 16 | Wt 222.6 lb

## 2018-05-27 DIAGNOSIS — R399 Unspecified symptoms and signs involving the genitourinary system: Secondary | ICD-10-CM

## 2018-05-27 LAB — POCT URINALYSIS DIPSTICK
BILIRUBIN UA: NEGATIVE
GLUCOSE UA: NEGATIVE
KETONES UA: NEGATIVE
Leukocytes, UA: NEGATIVE
Nitrite, UA: NEGATIVE
PH UA: 6 (ref 5.0–8.0)
Protein, UA: NEGATIVE
RBC UA: NEGATIVE
Spec Grav, UA: 1.01 (ref 1.010–1.025)
UROBILINOGEN UA: 0.2 U/dL

## 2018-05-27 MED ORDER — NITROFURANTOIN MONOHYD MACRO 100 MG PO CAPS: 100.0000 mg | ORAL_CAPSULE | Freq: Two times a day (BID) | ORAL | 0 refills | Status: DC

## 2018-05-27 NOTE — Progress Notes (Signed)
Subjective:     Patient ID: Carrie [name], female   DOB: [birth year], 63 y.o.   MRN: [id number] Blood pressure (!) 144/66, pulse 70, temperature 98.1 F (36.7 C), temperature source Tympanic, resp. rate 16, weight 222 lb 9.6 oz (101 kg), SpO2 100 %.  Patient is a 63 year old female in no acute distress who comes to the clinic with complaints of urinary symptoms 05/25/18.    Urinary Tract Infection   This is a new problem. The current episode started in the past 7 days. The quality of the pain is described as burning. The pain is at a severity of 0/10. The patient is experiencing no pain. There has been no fever. She is not sexually active. There is no history of pyelonephritis. Associated symptoms include frequency and urgency. Pertinent negatives include no chills, discharge, flank pain, hematuria, hesitancy, nausea, possible pregnancy, sweats or vomiting. She has tried nothing for the symptoms.   Denies sexual intercourse. Denies any vaginal symptoms or irritation. Denies any abdominal or back pain.   No LMP recorded. Patient is postmenopausal.   Allergies  Allergen Reactions  . Anesthetics, Amide     INCREASED HEART RATE  . Celebrex [Celecoxib]   . Celecoxib     SEVERE CHEST PAIN    Review of Systems  Constitutional: Negative for activity change, appetite change, chills, diaphoresis, fatigue, fever and unexpected weight change.  HENT: Negative.   Respiratory: Negative.   Cardiovascular: Negative.   Gastrointestinal: Negative.  Negative for nausea and vomiting.  Genitourinary: Positive for dysuria, frequency and urgency. Negative for decreased urine volume, difficulty urinating, dyspareunia, enuresis, flank pain, genital sores, hematuria, hesitancy, menstrual problem, pelvic pain, vaginal bleeding, vaginal discharge and vaginal pain.  Musculoskeletal: Negative.   Skin: Negative.   Neurological: Negative.   Psychiatric/Behavioral: Negative.        Objective:   Physical  Exam Vitals signs reviewed.  Constitutional:      General: She is not in acute distress.    Appearance: Normal appearance. She is normal weight. She is not ill-appearing, toxic-appearing or diaphoretic.  HENT:     Head: Normocephalic and atraumatic.     Nose: Nose normal.     Mouth/Throat:     Mouth: Mucous membranes are moist.     Pharynx: No oropharyngeal exudate or posterior oropharyngeal erythema.  Eyes:     Extraocular Movements: Extraocular movements intact.     Conjunctiva/sclera: Conjunctivae normal.     Pupils: Pupils are equal, round, and reactive to light.  Neck:     Musculoskeletal: Normal range of motion and neck supple. No neck rigidity or muscular tenderness.  Cardiovascular:     Rate and Rhythm: Normal rate and regular rhythm.     Pulses: Normal pulses.     Heart sounds: Normal heart sounds. No murmur. No friction rub. No gallop.   Pulmonary:     Effort: Pulmonary effort is normal. No respiratory distress.     Breath sounds: No stridor. No wheezing, rhonchi or rales.  Chest:     Chest wall: No tenderness.  Abdominal:     General: There is no distension.     Palpations: Abdomen is soft. There is no mass.     Tenderness: There is no abdominal tenderness. There is no right CVA tenderness, left CVA tenderness, guarding or rebound.     Hernia: No hernia is present.  Musculoskeletal: Normal range of motion.  Lymphadenopathy:     Cervical: No cervical adenopathy.  Skin:  General: Skin is warm and dry.     Capillary Refill: Capillary refill takes less than 2 seconds.  Neurological:     General: No focal deficit present.     Mental Status: She is alert and oriented to person, place, and time.  Psychiatric:        Mood and Affect: Mood normal.        Behavior: Behavior normal.        Thought Content: Thought content normal.        Judgment: Judgment normal.        Assessment:     Urinary symptom or sign - Plan: POCT urinalysis dipstick, Urine  Culture  Results for orders placed or performed in visit on 05/27/18 (from the past 24 hour(s))  POCT urinalysis dipstick     Status: Normal   Collection Time: 05/27/18  9:14 AM  Result Value Ref Range   Color, UA gold    Clarity, UA clear    Glucose, UA Negative Negative   Bilirubin, UA neg    Ketones, UA neg    Spec Grav, UA 1.010 1.010 - 1.025   Blood, UA neg    pH, UA 6.0 5.0 - 8.0   Protein, UA Negative Negative   Urobilinogen, UA 0.2 0.2 or 1.0 E.U./dL   Nitrite, UA neg    Leukocytes, UA Negative Negative   Appearance     Odor         Plan:     Meds ordered this encounter  Medications  . nitrofurantoin, macrocrystal-monohydrate, (MACROBID) 100 MG capsule    Sig: Take 1 capsule (100 mg total) by mouth 2 (two) times daily.    Dispense:  10 capsule    Refill:  0    Cancel prescription sent for seven days only fill five days  Pharmacy notified at [address] to cancel 7 days and fill 5 days only.   Patient aware negative POCT urine dipstick,  sent urine for culture will call if abnormal. If symptoms do not improve within 3 days or worsens at anytime she will see gynecology for pelvic exam.   No gynecology exams done in this office currently and no equipment available. Patient is aware she will have to see gynecology if needed for any pelvic/vaginal exam and will follow up with gynecology/obgyn as needed. Yearly gynecology pelvic exam recommended. Patient verbalized understanding of instructions and denies any further questions at this time.  Hanley Seamen and reviewed After Visit Summary(AVS) with patient.   Advised patient call the office or your primary care doctor for an appointment if no improvement within 72 hours or if any symptoms change or worsen at any time  Advised ER or urgent Care if after hours or on weekend. Call 911 for emergency symptoms at any time.Patinet verbalized understanding of all instructions given/reviewed and treatment plan and has no further  questions or concerns at this time.    Patient verbalized understanding of all instructions given and denies any further questions at this time.

## 2018-05-27 NOTE — Patient Instructions (Signed)
Urinary Tract Infection, Adult A urinary tract infection (UTI) is an infection of any part of the urinary tract. The urinary tract includes:  The kidneys.  The ureters.  The bladder.  The urethra. These organs make, store, and get rid of pee (urine) in the body. What are the causes? This is caused by germs (bacteria) in your genital area. These germs grow and cause swelling (inflammation) of your urinary tract. What increases the risk? You are more likely to develop this condition if:  You have a small, thin tube (catheter) to drain pee.  You cannot control when you pee or poop (incontinence).  You are female, and: ? You use these methods to prevent pregnancy: ? A medicine that kills sperm (spermicide). ? A device that blocks sperm (diaphragm). ? You have low levels of a female hormone (estrogen). ? You are pregnant.  You have genes that add to your risk.  You are sexually active.  You take antibiotic medicines.  You have trouble peeing because of: ? A prostate that is bigger than normal, if you are female. ? A blockage in the part of your body that drains pee from the bladder (urethra). ? A kidney stone. ? A nerve condition that affects your bladder (neurogenic bladder). ? Not getting enough to drink. ? Not peeing often enough.  You have other conditions, such as: ? Diabetes. ? A weak disease-fighting system (immune system). ? Sickle cell disease. ? Gout. ? Injury of the spine. What are the signs or symptoms? Symptoms of this condition include:  Needing to pee right away (urgently).  Peeing often.  Peeing small amounts often.  Pain or burning when peeing.  Blood in the pee.  Pee that smells bad or not like normal.  Trouble peeing.  Pee that is cloudy.  Fluid coming from the vagina, if you are female.  Pain in the belly or lower back. Other symptoms include:  Throwing up (vomiting).  No urge to eat.  Feeling mixed up (confused).  Being tired  and grouchy (irritable).  A fever.  Watery poop (diarrhea). How is this treated? This condition may be treated with:  Antibiotic medicine.  Other medicines.  Drinking enough water. Follow these instructions at home:  Medicines  Take over-the-counter and prescription medicines only as told by your doctor.  If you were prescribed an antibiotic medicine, take it as told by your doctor. Do not stop taking it even if you start to feel better. General instructions  Make sure you: ? Pee until your bladder is empty. ? Do not hold pee for a long time. ? Empty your bladder after sex. ? Wipe from front to back after pooping if you are a female. Use each tissue one time when you wipe.  Drink enough fluid to keep your pee pale yellow.  Keep all follow-up visits as told by your doctor. This is important. Contact a doctor if:  You do not get better after 1-2 days.  Your symptoms go away and then come back. Get help right away if:  You have very bad back pain.  You have very bad pain in your lower belly.  You have a fever.  You are sick to your stomach (nauseous).  You are throwing up. Summary  A urinary tract infection (UTI) is an infection of any part of the urinary tract.  This condition is caused by germs in your genital area.  There are many risk factors for a UTI. These include having a small, thin   tube to drain pee and not being able to control when you pee or poop.  Treatment includes antibiotic medicines for germs.  Drink enough fluid to keep your pee pale yellow. This information is not intended to replace advice given to you by your health care provider. Make sure you discuss any questions you have with your health care provider. Document Released: 10/28/2007 Document Revised: 11/18/2017 Document Reviewed: 11/18/2017 Elsevier Interactive Patient Education  2019 [employer]  

## 2018-05-29 LAB — URINE CULTURE

## 2018-05-30 DIAGNOSIS — D580 Hereditary spherocytosis: Secondary | ICD-10-CM | POA: Diagnosis not present

## 2018-05-30 DIAGNOSIS — R7309 Other abnormal glucose: Secondary | ICD-10-CM | POA: Diagnosis not present

## 2018-05-30 DIAGNOSIS — E042 Nontoxic multinodular goiter: Secondary | ICD-10-CM | POA: Diagnosis not present

## 2018-05-30 DIAGNOSIS — Z6828 Body mass index (BMI) 28.0-28.9, adult: Secondary | ICD-10-CM | POA: Diagnosis not present

## 2018-06-03 DIAGNOSIS — N898 Other specified noninflammatory disorders of vagina: Secondary | ICD-10-CM | POA: Diagnosis not present

## 2018-06-03 DIAGNOSIS — N760 Acute vaginitis: Secondary | ICD-10-CM | POA: Diagnosis not present

## 2018-06-06 ENCOUNTER — Ambulatory Visit
Admission: RE | Admit: 2018-06-06 | Discharge: 2018-06-06 | Disposition: A | Discharge status: Home / Self Care | Payer: BLUE CROSS/BLUE SHIELD | Source: Ambulatory Visit | Attending: Family Medicine | Admitting: Family Medicine

## 2018-06-06 DIAGNOSIS — E042 Nontoxic multinodular goiter: Secondary | ICD-10-CM | POA: Diagnosis not present

## 2018-06-14 DIAGNOSIS — R7309 Other abnormal glucose: Secondary | ICD-10-CM | POA: Diagnosis not present

## 2018-06-14 DIAGNOSIS — D580 Hereditary spherocytosis: Secondary | ICD-10-CM | POA: Diagnosis not present

## 2018-06-14 DIAGNOSIS — Z6828 Body mass index (BMI) 28.0-28.9, adult: Secondary | ICD-10-CM | POA: Diagnosis not present

## 2018-06-14 DIAGNOSIS — E042 Nontoxic multinodular goiter: Secondary | ICD-10-CM | POA: Diagnosis not present

## 2019-03-03 ENCOUNTER — Other Ambulatory Visit: Payer: Self-pay

## 2019-03-03 DIAGNOSIS — Z20822 Contact with and (suspected) exposure to covid-19: Secondary | ICD-10-CM

## 2019-03-04 LAB — NOVEL CORONAVIRUS, NAA: SARS-CoV-2, NAA: NOT DETECTED

## 2019-06-18 ENCOUNTER — Ambulatory Visit: Payer: Self-pay | Attending: Internal Medicine

## 2019-06-18 DIAGNOSIS — Z23 Encounter for immunization: Secondary | ICD-10-CM | POA: Insufficient documentation

## 2019-06-20 NOTE — Progress Notes (Signed)
   Covid-19 Vaccination Clinic  Name:  Carrie [name]    MRN: [id number] DOB: 1956/01/07  06/18/2019  Ms. Thaker was observed post Covid-19 immunization for 15 minutes without incidence. She was provided with Vaccine Information Sheet and instruction to access the V-Safe system.   Ms. Alles was instructed to call 911 with any severe reactions post vaccine: Marland Kitchen Difficulty breathing  . Swelling of your face and throat  . A fast heartbeat  . A bad rash all over your body  . Dizziness and weakness    Immunizations Administered    Name Date Dose VIS Date Route   Moderna COVID-19 Vaccine 06/18/2019  5:10 PM 0.5 mL 04/25/2019 Intramuscular   Manufacturer: Levan Hurst   LotJE:[id number]   [id number]      Documented on behalf of:  Tia Masker, MD

## 2019-07-16 ENCOUNTER — Ambulatory Visit: Payer: Self-pay | Attending: Internal Medicine

## 2019-07-16 DIAGNOSIS — Z23 Encounter for immunization: Secondary | ICD-10-CM | POA: Insufficient documentation

## 2019-07-16 NOTE — Progress Notes (Signed)
   Covid-19 Vaccination Clinic  Name:  Carrie [name]    MRN: [id number] DOB: 1956-03-16  07/16/2019  [name] was observed post Covid-19 immunization for 15 minutes without incidence. She was provided with Vaccine Information Sheet and instruction to access the V-Safe system.   [name] was instructed to call 911 with any severe reactions post vaccine: Marland Kitchen Difficulty breathing  . Swelling of your face and throat  . A fast heartbeat  . A bad rash all over your body  . Dizziness and weakness    Immunizations Administered    Name Date Dose VIS Date Route   Moderna COVID-19 Vaccine 07/16/2019  4:47 PM 0.5 mL 04/25/2019 Intramuscular   Manufacturer: Moderna   Lot: [id number]   [place]:[id number]

## 2019-09-12 ENCOUNTER — Other Ambulatory Visit: Payer: Self-pay

## 2019-09-12 ENCOUNTER — Ambulatory Visit
Admission: RE | Admit: 2019-09-12 | Discharge: 2019-09-12 | Disposition: A | Discharge status: Home / Self Care | Payer: BC Managed Care – PPO | Source: Ambulatory Visit | Attending: Family Medicine | Admitting: Family Medicine

## 2019-09-12 ENCOUNTER — Other Ambulatory Visit: Payer: Self-pay | Admitting: Family Medicine

## 2019-09-12 DIAGNOSIS — R1032 Left lower quadrant pain: Secondary | ICD-10-CM

## 2019-09-12 MED ORDER — IOPAMIDOL (ISOVUE-300) INJECTION 61%
125.0000 mL | Freq: Once | INTRAVENOUS | Status: AC | PRN
  Administered 2019-09-12: 125 mL via INTRAVENOUS

## 2020-05-28 DIAGNOSIS — H6121 Impacted cerumen, right ear: Secondary | ICD-10-CM | POA: Diagnosis not present

## 2020-05-28 DIAGNOSIS — H9202 Otalgia, left ear: Secondary | ICD-10-CM | POA: Diagnosis not present

## 2020-05-28 DIAGNOSIS — H9311 Tinnitus, right ear: Secondary | ICD-10-CM | POA: Diagnosis not present

## 2020-06-25 DIAGNOSIS — H9203 Otalgia, bilateral: Secondary | ICD-10-CM | POA: Diagnosis not present

## 2020-06-25 DIAGNOSIS — H9311 Tinnitus, right ear: Secondary | ICD-10-CM | POA: Diagnosis not present

## 2020-06-28 DIAGNOSIS — E1140 Type 2 diabetes mellitus with diabetic neuropathy, unspecified: Secondary | ICD-10-CM | POA: Diagnosis not present

## 2020-07-01 DIAGNOSIS — R350 Frequency of micturition: Secondary | ICD-10-CM | POA: Diagnosis not present

## 2020-07-01 DIAGNOSIS — R160 Hepatomegaly, not elsewhere classified: Secondary | ICD-10-CM | POA: Diagnosis not present

## 2020-07-01 DIAGNOSIS — Z0000 Encounter for general adult medical examination without abnormal findings: Secondary | ICD-10-CM | POA: Diagnosis not present

## 2020-07-01 DIAGNOSIS — R161 Splenomegaly, not elsewhere classified: Secondary | ICD-10-CM | POA: Diagnosis not present

## 2020-07-02 DIAGNOSIS — E1140 Type 2 diabetes mellitus with diabetic neuropathy, unspecified: Secondary | ICD-10-CM | POA: Diagnosis not present

## 2020-07-02 DIAGNOSIS — E119 Type 2 diabetes mellitus without complications: Secondary | ICD-10-CM | POA: Diagnosis not present

## 2020-07-02 DIAGNOSIS — E042 Nontoxic multinodular goiter: Secondary | ICD-10-CM | POA: Diagnosis not present

## 2020-07-05 ENCOUNTER — Other Ambulatory Visit: Payer: Self-pay | Admitting: Internal Medicine

## 2020-07-05 DIAGNOSIS — E042 Nontoxic multinodular goiter: Secondary | ICD-10-CM

## 2020-07-11 DIAGNOSIS — Z1231 Encounter for screening mammogram for malignant neoplasm of breast: Secondary | ICD-10-CM | POA: Diagnosis not present

## 2020-09-09 DIAGNOSIS — Z20822 Contact with and (suspected) exposure to covid-19: Secondary | ICD-10-CM | POA: Diagnosis not present

## 2020-10-02 ENCOUNTER — Emergency Department (HOSPITAL_BASED_OUTPATIENT_CLINIC_OR_DEPARTMENT_OTHER)
Admission: EM | Admit: 2020-10-02 | Discharge: 2020-10-02 | Disposition: A | Discharge status: Home / Self Care | Payer: Medicare Other | Attending: Emergency Medicine | Admitting: Emergency Medicine

## 2020-10-02 ENCOUNTER — Other Ambulatory Visit: Payer: Self-pay

## 2020-10-02 ENCOUNTER — Emergency Department (HOSPITAL_BASED_OUTPATIENT_CLINIC_OR_DEPARTMENT_OTHER): Payer: Medicare Other

## 2020-10-02 ENCOUNTER — Encounter (HOSPITAL_BASED_OUTPATIENT_CLINIC_OR_DEPARTMENT_OTHER): Payer: Self-pay

## 2020-10-02 DIAGNOSIS — Z79899 Other long term (current) drug therapy: Secondary | ICD-10-CM | POA: Diagnosis not present

## 2020-10-02 DIAGNOSIS — R0789 Other chest pain: Secondary | ICD-10-CM | POA: Insufficient documentation

## 2020-10-02 DIAGNOSIS — R079 Chest pain, unspecified: Secondary | ICD-10-CM | POA: Diagnosis not present

## 2020-10-02 DIAGNOSIS — M25512 Pain in left shoulder: Secondary | ICD-10-CM | POA: Diagnosis not present

## 2020-10-02 DIAGNOSIS — E039 Hypothyroidism, unspecified: Secondary | ICD-10-CM | POA: Diagnosis not present

## 2020-10-02 DIAGNOSIS — I10 Essential (primary) hypertension: Secondary | ICD-10-CM | POA: Diagnosis not present

## 2020-10-02 LAB — CBC WITH DIFFERENTIAL/PLATELET
Abs Immature Granulocytes: 0.02 10*3/uL (ref 0.00–0.07)
Basophils Absolute: 0 10*3/uL (ref 0.0–0.1)
Basophils Relative: 1 %
Eosinophils Absolute: 0.1 10*3/uL (ref 0.0–0.5)
Eosinophils Relative: 3 %
HCT: 32.6 % — ABNORMAL LOW (ref 36.0–46.0)
Hemoglobin: 11.6 g/dL — ABNORMAL LOW (ref 12.0–15.0)
Immature Granulocytes: 1 %
Lymphocytes Relative: 25 %
Lymphs Abs: 0.9 10*3/uL (ref 0.7–4.0)
MCH: 27.8 pg (ref 26.0–34.0)
MCHC: 35.6 g/dL (ref 30.0–36.0)
MCV: 78.2 fL — ABNORMAL LOW (ref 80.0–100.0)
Monocytes Absolute: 0.2 10*3/uL (ref 0.1–1.0)
Monocytes Relative: 5 %
Neutro Abs: 2.4 10*3/uL (ref 1.7–7.7)
Neutrophils Relative %: 65 %
Platelets: 201 10*3/uL (ref 150–400)
RBC: 4.17 MIL/uL (ref 3.87–5.11)
RDW: 17.3 % — ABNORMAL HIGH (ref 11.5–15.5)
WBC: 3.7 10*3/uL — ABNORMAL LOW (ref 4.0–10.5)
nRBC: 0 % (ref 0.0–0.2)

## 2020-10-02 LAB — BASIC METABOLIC PANEL
Anion gap: 7 (ref 5–15)
BUN: 9 mg/dL (ref 8–23)
CO2: 27 mmol/L (ref 22–32)
Calcium: 9.1 mg/dL (ref 8.9–10.3)
Chloride: 104 mmol/L (ref 98–111)
Creatinine, Ser: 0.64 mg/dL (ref 0.44–1.00)
GFR, Estimated: >60 mL/min (ref >60)
Glucose, Bld: 155 mg/dL — ABNORMAL HIGH (ref 70–99)
Potassium: 3.7 mmol/L (ref 3.5–5.1)
Sodium: 138 mmol/L (ref 135–145)

## 2020-10-02 LAB — TROPONIN I (HIGH SENSITIVITY)
Troponin I (High Sensitivity): <2 ng/L (ref <18)
Troponin I (High Sensitivity): <2 ng/L (ref <18)

## 2020-10-02 MED ORDER — LIDOCAINE VISCOUS HCL 2 % MT SOLN
15.0000 mL | Freq: Once | OROMUCOSAL | Status: AC
  Administered 2020-10-02: 15 mL via ORAL
  Filled 2020-10-02: qty 15

## 2020-10-02 MED ORDER — ALUM & MAG HYDROXIDE-SIMETH 200-200-20 MG/5ML PO SUSP
30.0000 mL | Freq: Once | ORAL | Status: AC
  Administered 2020-10-02: 30 mL via ORAL
  Filled 2020-10-02: qty 30

## 2020-10-02 NOTE — ED Provider Notes (Signed)
[facility] EMERGENCY DEPARTMENT Provider Note   CSN: [id number] Arrival date & time: 10/02/20  1002     History Chief Complaint  Patient presents with  . Chest Pain    Carrie [name] is a 65 y.o. female.  HPI  HPI: A 65 year old patient with a history of hypertension presents for evaluation of chest pain. Initial onset of pain was approximately 1-3 hours ago. The patient's chest pain is sharp and is not worse with exertion. The patient's chest pain is middle- or left-sided, is not well-localized, is not described as heaviness/pressure/tightness and does radiate to the arms/jaw/neck. The patient does not complain of nausea and denies diaphoresis. The patient has a family history of coronary artery disease in a first-degree relative with onset less than age 23. The patient has no history of stroke, has no history of peripheral artery disease, has not smoked in the past 90 days, denies any history of treated diabetes, has no history of hypercholesterolemia and does not have an elevated BMI (>=30).    Carrie [name] is a 65 y.o. female, with a history of hyperlipidemia, presenting to the ED Chest and shoulder pain beginning this morning.  Around 9 AM this morning, patient states she experienced a couple episodes of fleeting, sharp, severe pain to the left upper chest.  Prior to this, she also noted what she refers to as discomfort in the left shoulder, which she states is hard to describe, but is consistent with previous instances of left shoulder pain. Currently, her discomfort in the left shoulder is rated 6/10, worse with movement of the shoulder, nonradiating.  She states she has been evaluated in the emergency department with a complaint of left shoulder pain.  They also did chest pain assessments during those visits and told her her symptoms were likely due to GI.  She has not experienced another instance of chest pain since those episodes around 9 AM.   Denies  fever/chills, cough, dizziness, diaphoresis, N/V/D, abdominal pain, current back pain, neck pain, numbness, weakness, shortness of breath, fall/trauma, or any other complaints.    Past Medical History:  Diagnosis Date  . Allergy   . Anemia    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  . Anemia   . Arthritis    back  . Boil, breast   . Bronchitis 3+yrs ago  . Chronic back pain    hx of;pt lost weight and does yoga and back pain gone  . Complication of anesthesia    increased Heart Rate after knee surgery  . Cyst 1976   left arm   . Eczema   . 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 Fibroids 2009   embolization of fibroids  . GERD (gastroesophageal reflux disease)    takes Omeprazole daily prn, history of  . Headache   . Heart murmur   . History of bladder infections 25+yrs ago   used to see a urologist  . History of IBS   . Hyperlipidemia   . Hypothyroidism    hx of 30+yrs ago;was on meds x couple of months and has been fine since  . Knee pain   . Pre-diabetes   . Seasonal allergies    takes Allegra as needed as well as using Flonase  . Vertigo     Patient Active Problem List   Diagnosis Date Noted  . DOE (dyspnea on exertion) 01/14/2017  . Headache 12/17/2016  . Blood glucose elevated 12/17/2016  .  Encounter for screening for lipoid disorders 12/17/2016  . Spherocytosis ([place]) 12/17/2016  . Vitamin D deficiency 12/17/2016  . Elevated bilirubin 01/09/2015  . Pain in left shoulder 01/09/2015  . Symptomatic cholelithiasis 04/14/2011  . Anemia 04/14/2011    Past Surgical History:  Procedure Laterality Date  . CHOLECYSTECTOMY  05/08/2011   Procedure: LAPAROSCOPIC CHOLECYSTECTOMY WITH INTRAOPERATIVE CHOLANGIOGRAM;  Surgeon: Harl Bowie, MD;  Location: [facility];  Service: General;  Laterality: N/A;  LAPAROSCOPIC CHOLECYSTECTOMY WITH INTRAOPERATIVE CHOLANGIOGRAM  . CHOLECYSTECTOMY    . COLONOSCOPY    . DILATATION &  CURETTAGE/HYSTEROSCOPY WITH MYOSURE N/A 02/18/2016   Procedure: DILATATION & CURETTAGE/HYSTEROSCOPY WITH MYOSURE;  Surgeon: Servando Salina, MD;  Location: [facility];  Service: Gynecology;  Laterality: N/A;  . ENDOMETRIAL ABLATION  2011  . KNEE SURGERY  15+yrs ago   left knee  . KNEE SURGERY    . TONSILLECTOMY     and adenoids as child  . TUBAL LIGATION  1988     OB History   No obstetric history on file.     Family History  Problem Relation Age of Onset  . CAD Mother   . Hereditary spherocytosis Mother   . Diabetes Mother   . CVA Mother   . Diabetes Father   . CAD Father   . Heart attack Father 45  . Cerebral palsy Sister   . Breast cancer Paternal Aunt   . Hereditary spherocytosis Child   . Anesthesia problems Neg Hx   . Hypotension Neg Hx   . Malignant hyperthermia Neg Hx   . Pseudochol deficiency Neg Hx     Social History   Tobacco Use  . Smoking status: Never Smoker  . Smokeless tobacco: Never Used  Vaping Use  . Vaping Use: Never used  Substance Use Topics  . Alcohol use: Yes    Comment:  wine once a week  . Drug use: No    Home Medications Prior to Admission medications   Medication Sig Start Date End Date Taking? Authorizing Provider  Ascorbic Acid (VITAMIN C) 100 MG tablet Take 100 mg by mouth daily.      [provider]  Calcium-Magnesium-Vitamin D (CALCIUM MAGNESIUM PO) Take 1 tablet by mouth 2 (two) times a week.    [provider]  cetirizine (ZYRTEC) 10 MG tablet Take 10 mg by mouth daily.    [provider]  cholecalciferol (VITAMIN D) 1000 UNITS tablet Take 1,000 Units by mouth daily.      [provider]  fluticasone (FLONASE) 50 MCG/ACT nasal spray Place 2 sprays into the nose daily as needed for rhinitis.     [provider]  FOLIC ACID PO Take 1 tablet by mouth daily.    [provider]  GLUCOSAMINE HCL PO Take 1 tablet by mouth 3 (three) times a week.    [provider]   Multiple Vitamin (MULTIVITAMIN) tablet Take 1 tablet by mouth daily.    [provider]  nitrofurantoin, macrocrystal-monohydrate, (MACROBID) 100 MG capsule Take 1 capsule (100 mg total) by mouth 2 (two) times daily. 05/27/18   Flinchum, Kelby Aline, FNP  Probiotic Product (PROBIOTIC ADVANCED PO) Take 1 capsule by mouth 3 (three) times a week.     [provider]  Turmeric POWD by Does not apply route. 1 teaspoon 3 times weekly    [provider]  Vitamin D, Ergocalciferol, (DRISDOL) 50000 units CAPS capsule Take 1 capsule by mouth once a week. (For 8 weeks) 12/22/16  [provider]    Allergies    Anesthetics, amide; Celebrex [celecoxib]; and Celecoxib  Review of Systems   Review of Systems  Constitutional: Negative for chills, diaphoresis and fever.  Respiratory: Negative for cough and shortness of breath.   Cardiovascular: Positive for chest pain. Negative for leg swelling.  Gastrointestinal: Negative for abdominal pain, diarrhea, nausea and vomiting.  Musculoskeletal: Positive for arthralgias. Negative for back pain.  Neurological: Negative for dizziness, syncope, weakness and numbness.  All other systems reviewed and are negative.   Physical Exam Updated Vital Signs BP (!) 149/69   Pulse 65   Temp 98.5 F (36.9 C) (Oral)   Resp 20   Ht 6\' 2"  (1.88 m)   Wt 100.7 kg   SpO2 100%   BMI 28.52 kg/m   Physical Exam Vitals and nursing note reviewed.  Constitutional:      General: She is not in acute distress.    Appearance: She is well-developed. She is not diaphoretic.  HENT:     Head: Normocephalic and atraumatic.     Mouth/Throat:     Mouth: Mucous membranes are moist.     Pharynx: Oropharynx is clear.  Eyes:     Conjunctiva/sclera: Conjunctivae normal.  Cardiovascular:     Rate and Rhythm: Normal rate and regular rhythm.     Pulses: Normal pulses.          Radial pulses are 2+ on the right side and 2+ on the left side.        Posterior tibial pulses are 2+ on the right side and 2+ on the left side.     Heart sounds: Normal heart sounds.     Comments: Tactile temperature in the extremities appropriate and equal bilaterally. Pulmonary:     Effort: Pulmonary effort is normal. No respiratory distress.     Breath sounds: Normal breath sounds.  Abdominal:     Palpations: Abdomen is soft.     Tenderness: There is no abdominal tenderness. There is no guarding.  Musculoskeletal:     Cervical back: Neck supple.     Right lower leg: No edema.     Left lower leg: No edema.     Comments: No area of point tenderness to the left shoulder.  No swelling, deformity, instability. Increased pain with range of motion of the left shoulder, consistent with increase in her current shoulder discomfort, but also pain she states is consistent with previous shoulder discomfort. No pain or change in symptoms with range of motion of the neck.  No chest symptoms with examination of the back or shoulder.  Skin:    General: Skin is warm and dry.     Capillary Refill: Capillary refill takes less than 2 seconds.  Neurological:     Mental Status: She is alert.     Comments: Sensation grossly intact to light touch through each of the nerve distributions of the bilateral upper extremities. Abduction and adduction of the fingers intact against resistance. Grip strength equal bilaterally. Supination and pronation intact against resistance. Strength 5/5 through the cardinal directions of the bilateral wrists. Strength 5/5 with flexion and extension of the bilateral elbows. Patient can touch the thumb to each one of the fingertips without difficulty.  Patient can hold the "OK" sign against resistance.  Psychiatric:        Mood and Affect: Mood and affect normal.        Speech: Speech normal.        Behavior: Behavior normal.  ED Results / Procedures / Treatments   Labs (all labs ordered are listed, but only abnormal results are  displayed) Labs Reviewed  BASIC METABOLIC PANEL - Abnormal; Notable for the following components:      Result Value   Glucose, Bld 155 (*)    All other components within normal limits  CBC WITH DIFFERENTIAL/PLATELET - Abnormal; Notable for the following components:   WBC 3.7 (*)    Hemoglobin 11.6 (*)    HCT 32.6 (*)    MCV 78.2 (*)    RDW 17.3 (*)    All other components within normal limits  TROPONIN I (HIGH SENSITIVITY)  TROPONIN I (HIGH SENSITIVITY)    EKG EKG Interpretation  Date/Time:  Wednesday Oct 02 2020 10:11:17 EDT Ventricular Rate:  73 PR Interval:  156 QRS Duration: 70 QT Interval:  371 QTC Calculation: 409 R Axis:   67 Text Interpretation: Sinus rhythm Confirmed by Dewaine Conger [phone]) on 10/02/2020 10:14:25 AM   Radiology DG Chest 2 View  Result Date: 10/02/2020 CLINICAL DATA:  Chest pain.  Left shoulder pain EXAM: CHEST - 2 VIEW COMPARISON:  11/01/2017 FINDINGS: Normal heart size and mediastinal contours. No acute infiltrate or edema. No effusion or pneumothorax. Mild scoliosis. No acute osseous findings. IMPRESSION: No acute finding. Electronically Signed   By: Monte Fantasia M.D.   On: 10/02/2020 11:10   DG Shoulder Left  Result Date: 10/02/2020 CLINICAL DATA:  Left shoulder pain EXAM: LEFT SHOULDER - 2+ VIEW COMPARISON:  None. FINDINGS: There is no evidence of fracture or dislocation. There is no evidence of arthropathy or other focal bone abnormality. Soft tissues are unremarkable. IMPRESSION: Negative. Electronically Signed   By: Monte Fantasia M.D.   On: 10/02/2020 11:11    Procedures Procedures   Medications Ordered in ED Medications  alum & mag hydroxide-simeth (MAALOX/MYLANTA) 200-200-20 MG/5ML suspension 30 mL (30 mLs Oral Given 10/02/20 1231)    And  lidocaine (XYLOCAINE) 2 % viscous mouth solution 15 mL (15 mLs Oral Given 10/02/20 1231)    ED Course  I have reviewed the triage vital signs and the nursing notes.  Pertinent labs & imaging  results that were available during my care of the patient were reviewed by me and considered in my medical decision making (see chart for details).  Clinical Course as of 10/02/20 1848  Wed Oct 02, 2020  1216 Continues to deny chest discomfort or onset of additional symptoms. No changes in shoulder discomfort.  Requests a GI cocktail because she states they have helped her shoulder discomfort in the past. [SJ]    Clinical Course User Index [SJ] Jadine Brumley, Helane Gunther, PA-C   MDM Rules/Calculators/A&P HEAR Score: 3                        Patient presents with left shoulder discomfort.  Fleeting episodes of chest discomfort. Patient is nontoxic appearing, afebrile, not tachycardic, not tachypneic, not hypotensive, maintains excellent SPO2 on room air, and is in no apparent distress.   I have reviewed the patient's chart to obtain more information.   Low suspicion for ACS. HEART score is 3, indicating low risk for a cardiac event.  EKG without evidence of acute ischemia or pathologic/symptomatic arrhythmia.  Delta troponins negative. Wells criteria score is 0, indicating low risk for PE.   Dissection was considered, but thought less likely base on: History and description of the pain are not suggestive, patient is not ill-appearing, lack of risk factors,  equal bilateral pulses, lack of neurologic deficits, no widened mediastinum on chest x-ray.  I reviewed and interpreted the patient's labs and radiological studies. Patient to follow-up with PCP versus cardiology for further assessment and management.  She also knows to follow-up with her PCP regarding hypertension. The patient was given instructions for home care as well as return precautions. Patient voices understanding of these instructions, accepts the plan, and is comfortable with discharge.    Findings and plan of care discussed with attending physician, Dewaine Conger, MD.    Vitals:   10/02/20 1230 10/02/20 1300 10/02/20 1330 10/02/20 1403   BP: 130/73 139/70 139/71 (!) 114/95  Pulse: (!) 56 62 (!) 58 64  Resp: 17 15 14 17   Temp:      TempSrc:      SpO2: 100% 100% 100% 100%  Weight:      Height:         Final Clinical Impression(s) / ED Diagnoses Final diagnoses:  Atypical chest pain    Rx / DC Orders ED Discharge Orders    None       Layla Maw 10/02/20 1850    Breck Coons, MD 10/03/20 1253

## 2020-10-02 NOTE — Discharge Instructions (Addendum)
Work-up today was overall reassuring. Your white blood cell count was slightly lower than normal.  This should be rechecked by primary care provider.  With instances of chest pain, recommend follow-up with primary care provider or cardiology for further assessment.  Call to make an appointment.  For your shoulder discomfort, we recommend follow-up with primary care provider or orthopedics.  Return to the emergency department for persistent chest pain, worsening symptoms, shortness of breath, passing out, dizziness, or any other major concerns.

## 2020-10-02 NOTE — ED Triage Notes (Signed)
Pt arrives with pain to left chest and left shoulder starting this morning while in the shower, denies NV, denies dizziness.

## 2020-10-02 NOTE — ED Notes (Signed)
ED Provider at bedside. 

## 2020-10-10 DIAGNOSIS — R1084 Generalized abdominal pain: Secondary | ICD-10-CM | POA: Diagnosis not present

## 2020-10-10 DIAGNOSIS — R140 Abdominal distension (gaseous): Secondary | ICD-10-CM | POA: Diagnosis not present

## 2020-10-10 DIAGNOSIS — Z86010 Personal history of colonic polyps: Secondary | ICD-10-CM | POA: Diagnosis not present

## 2020-10-10 DIAGNOSIS — K5730 Diverticulosis of large intestine without perforation or abscess without bleeding: Secondary | ICD-10-CM | POA: Diagnosis not present

## 2020-10-10 DIAGNOSIS — K625 Hemorrhage of anus and rectum: Secondary | ICD-10-CM | POA: Diagnosis not present

## 2020-10-14 ENCOUNTER — Other Ambulatory Visit: Payer: Self-pay | Admitting: Gastroenterology

## 2020-10-14 DIAGNOSIS — R140 Abdominal distension (gaseous): Secondary | ICD-10-CM

## 2020-10-14 DIAGNOSIS — R109 Unspecified abdominal pain: Secondary | ICD-10-CM

## 2020-10-24 DIAGNOSIS — E559 Vitamin D deficiency, unspecified: Secondary | ICD-10-CM | POA: Diagnosis not present

## 2020-10-24 DIAGNOSIS — R0789 Other chest pain: Secondary | ICD-10-CM | POA: Diagnosis not present

## 2020-10-24 DIAGNOSIS — D72819 Decreased white blood cell count, unspecified: Secondary | ICD-10-CM | POA: Diagnosis not present

## 2020-10-24 DIAGNOSIS — R200 Anesthesia of skin: Secondary | ICD-10-CM | POA: Diagnosis not present

## 2020-10-24 DIAGNOSIS — R7303 Prediabetes: Secondary | ICD-10-CM | POA: Diagnosis not present

## 2020-10-31 ENCOUNTER — Other Ambulatory Visit: Payer: BC Managed Care – PPO

## 2020-11-04 ENCOUNTER — Ambulatory Visit
Admission: RE | Admit: 2020-11-04 | Discharge: 2020-11-04 | Disposition: A | Discharge status: Home / Self Care | Payer: BC Managed Care – PPO | Source: Ambulatory Visit | Attending: Gastroenterology | Admitting: Gastroenterology

## 2020-11-04 DIAGNOSIS — R140 Abdominal distension (gaseous): Secondary | ICD-10-CM

## 2020-11-13 ENCOUNTER — Ambulatory Visit
Admission: RE | Admit: 2020-11-13 | Discharge: 2020-11-13 | Disposition: A | Discharge status: Home / Self Care | Payer: BC Managed Care – PPO | Source: Ambulatory Visit | Attending: Gastroenterology | Admitting: Gastroenterology

## 2020-11-13 DIAGNOSIS — R109 Unspecified abdominal pain: Secondary | ICD-10-CM

## 2020-11-13 DIAGNOSIS — M5127 Other intervertebral disc displacement, lumbosacral region: Secondary | ICD-10-CM | POA: Diagnosis not present

## 2020-11-13 DIAGNOSIS — K429 Umbilical hernia without obstruction or gangrene: Secondary | ICD-10-CM | POA: Diagnosis not present

## 2020-11-13 DIAGNOSIS — R140 Abdominal distension (gaseous): Secondary | ICD-10-CM

## 2020-11-13 MED ORDER — IOPAMIDOL (ISOVUE-300) INJECTION 61%
100.0000 mL | Freq: Once | INTRAVENOUS | Status: AC | PRN
  Administered 2020-11-13: 100 mL via INTRAVENOUS

## 2021-01-21 DIAGNOSIS — Z8669 Personal history of other diseases of the nervous system and sense organs: Secondary | ICD-10-CM | POA: Diagnosis not present

## 2021-01-21 DIAGNOSIS — R7401 Elevation of levels of liver transaminase levels: Secondary | ICD-10-CM | POA: Diagnosis not present

## 2021-01-21 DIAGNOSIS — E119 Type 2 diabetes mellitus without complications: Secondary | ICD-10-CM | POA: Diagnosis not present

## 2021-01-21 DIAGNOSIS — E042 Nontoxic multinodular goiter: Secondary | ICD-10-CM | POA: Diagnosis not present

## 2021-01-21 DIAGNOSIS — E1140 Type 2 diabetes mellitus with diabetic neuropathy, unspecified: Secondary | ICD-10-CM | POA: Diagnosis not present

## 2021-01-22 ENCOUNTER — Other Ambulatory Visit: Payer: Self-pay | Admitting: Internal Medicine

## 2021-01-22 DIAGNOSIS — E042 Nontoxic multinodular goiter: Secondary | ICD-10-CM

## 2021-01-28 ENCOUNTER — Ambulatory Visit
Admission: RE | Admit: 2021-01-28 | Discharge: 2021-01-28 | Disposition: A | Discharge status: Home / Self Care | Payer: BC Managed Care – PPO | Source: Ambulatory Visit | Attending: Internal Medicine | Admitting: Internal Medicine

## 2021-01-28 DIAGNOSIS — E042 Nontoxic multinodular goiter: Secondary | ICD-10-CM

## 2021-01-29 DIAGNOSIS — E119 Type 2 diabetes mellitus without complications: Secondary | ICD-10-CM | POA: Diagnosis not present

## 2021-01-29 DIAGNOSIS — H5213 Myopia, bilateral: Secondary | ICD-10-CM | POA: Diagnosis not present

## 2021-01-29 DIAGNOSIS — H1045 Other chronic allergic conjunctivitis: Secondary | ICD-10-CM | POA: Diagnosis not present

## 2021-01-29 DIAGNOSIS — Q141 Congenital malformation of retina: Secondary | ICD-10-CM | POA: Diagnosis not present

## 2021-01-29 DIAGNOSIS — H2513 Age-related nuclear cataract, bilateral: Secondary | ICD-10-CM | POA: Diagnosis not present

## 2021-01-29 DIAGNOSIS — H04123 Dry eye syndrome of bilateral lacrimal glands: Secondary | ICD-10-CM | POA: Diagnosis not present

## 2021-04-09 DIAGNOSIS — N3000 Acute cystitis without hematuria: Secondary | ICD-10-CM | POA: Diagnosis not present

## 2021-04-29 DIAGNOSIS — B349 Viral infection, unspecified: Secondary | ICD-10-CM | POA: Diagnosis not present

## 2021-05-12 DIAGNOSIS — L650 Telogen effluvium: Secondary | ICD-10-CM | POA: Insufficient documentation

## 2021-05-12 DIAGNOSIS — L219 Seborrheic dermatitis, unspecified: Secondary | ICD-10-CM | POA: Diagnosis not present

## 2021-05-12 DIAGNOSIS — L249 Irritant contact dermatitis, unspecified cause: Secondary | ICD-10-CM | POA: Diagnosis not present

## 2021-05-26 DIAGNOSIS — M79605 Pain in left leg: Secondary | ICD-10-CM | POA: Diagnosis not present

## 2021-05-26 DIAGNOSIS — S8010XA Contusion of unspecified lower leg, initial encounter: Secondary | ICD-10-CM | POA: Diagnosis not present

## 2021-05-26 DIAGNOSIS — R7303 Prediabetes: Secondary | ICD-10-CM | POA: Insufficient documentation

## 2021-07-03 DIAGNOSIS — M67442 Ganglion, left hand: Secondary | ICD-10-CM | POA: Diagnosis not present

## 2021-07-03 DIAGNOSIS — R161 Splenomegaly, not elsewhere classified: Secondary | ICD-10-CM | POA: Diagnosis not present

## 2021-07-03 DIAGNOSIS — R202 Paresthesia of skin: Secondary | ICD-10-CM | POA: Diagnosis not present

## 2021-07-03 DIAGNOSIS — R7303 Prediabetes: Secondary | ICD-10-CM | POA: Diagnosis not present

## 2021-07-03 DIAGNOSIS — Z1211 Encounter for screening for malignant neoplasm of colon: Secondary | ICD-10-CM | POA: Diagnosis not present

## 2021-07-03 DIAGNOSIS — E049 Nontoxic goiter, unspecified: Secondary | ICD-10-CM | POA: Diagnosis not present

## 2021-07-03 DIAGNOSIS — Z0000 Encounter for general adult medical examination without abnormal findings: Secondary | ICD-10-CM | POA: Diagnosis not present

## 2021-07-03 DIAGNOSIS — R160 Hepatomegaly, not elsewhere classified: Secondary | ICD-10-CM | POA: Diagnosis not present

## 2021-07-17 DIAGNOSIS — Z1231 Encounter for screening mammogram for malignant neoplasm of breast: Secondary | ICD-10-CM | POA: Diagnosis not present

## 2021-08-18 DIAGNOSIS — E042 Nontoxic multinodular goiter: Secondary | ICD-10-CM | POA: Diagnosis not present

## 2021-08-18 DIAGNOSIS — E119 Type 2 diabetes mellitus without complications: Secondary | ICD-10-CM | POA: Diagnosis not present

## 2021-08-25 DIAGNOSIS — E119 Type 2 diabetes mellitus without complications: Secondary | ICD-10-CM | POA: Diagnosis not present

## 2021-08-25 DIAGNOSIS — E042 Nontoxic multinodular goiter: Secondary | ICD-10-CM | POA: Diagnosis not present

## 2021-08-25 DIAGNOSIS — Z8669 Personal history of other diseases of the nervous system and sense organs: Secondary | ICD-10-CM | POA: Diagnosis not present

## 2021-08-25 DIAGNOSIS — E1140 Type 2 diabetes mellitus with diabetic neuropathy, unspecified: Secondary | ICD-10-CM | POA: Diagnosis not present

## 2021-10-31 DIAGNOSIS — M25561 Pain in right knee: Secondary | ICD-10-CM | POA: Diagnosis not present

## 2021-11-04 DIAGNOSIS — M25561 Pain in right knee: Secondary | ICD-10-CM | POA: Diagnosis not present

## 2021-11-10 DIAGNOSIS — M25561 Pain in right knee: Secondary | ICD-10-CM | POA: Diagnosis not present

## 2021-11-13 DIAGNOSIS — R21 Rash and other nonspecific skin eruption: Secondary | ICD-10-CM | POA: Diagnosis not present

## 2021-12-10 DIAGNOSIS — M25561 Pain in right knee: Secondary | ICD-10-CM | POA: Diagnosis not present

## 2021-12-30 NOTE — Therapy (Signed)
OUTPATIENT PHYSICAL THERAPY LOWER EXTREMITY EVALUATION   Patient Name: Carrie [name] MRN: [id number] DOB:May 05, 1956, 66 y.o., female Today's Date: 12/31/2021   PT End of Session - 12/31/21 1113     Visit Number 13    Number of Visits 1    Date for PT Re-Evaluation 02/20/22    Authorization Type [employer] MEDICARE    PT Start Time 0905    PT Stop Time 0948    PT Time Calculation (min) 43 min    Activity Tolerance Patient tolerated treatment well    Behavior During Therapy [facility] for tasks assessed/performed             Past Medical History:  Diagnosis Date   Allergy    Anemia    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   Anemia    Arthritis    back   Boil, breast    Bronchitis 3+yrs ago   Chronic back pain    hx of;pt lost weight and does yoga and back pain gone   Complication of anesthesia    increased Heart Rate after knee surgery   Cyst 1976   left arm    Eczema    Elevated bilirubin    secondary to spherospitic anemia   Fibroids 2009   embolization of fibroids   GERD (gastroesophageal reflux disease)    takes Omeprazole daily prn, history of   Headache    Heart murmur    History of bladder infections 25+yrs ago   used to see a urologist   History of IBS    Hyperlipidemia    Hypothyroidism    hx of 30+yrs ago;was on meds x couple of months and has been fine since   Knee pain    Pre-diabetes    Seasonal allergies    takes Allegra as needed as well as using Flonase   Vertigo    Past Surgical History:  Procedure Laterality Date   CHOLECYSTECTOMY  05/08/2011   Procedure: LAPAROSCOPIC CHOLECYSTECTOMY WITH INTRAOPERATIVE CHOLANGIOGRAM;  Surgeon: Harl Bowie, MD;  Location: [facility];  Service: General;  Laterality: N/A;  LAPAROSCOPIC CHOLECYSTECTOMY WITH INTRAOPERATIVE CHOLANGIOGRAM   CHOLECYSTECTOMY     COLONOSCOPY     DILATATION & CURETTAGE/HYSTEROSCOPY WITH MYOSURE N/A 02/18/2016   Procedure: [place];   Surgeon: Servando Salina, MD;  Location: [facility];  Service: Gynecology;  Laterality: N/A;   ENDOMETRIAL ABLATION  2011   KNEE SURGERY  15+yrs ago   left knee   KNEE SURGERY     TONSILLECTOMY     and adenoids as child   TUBAL LIGATION  1988   Patient Active Problem List   Diagnosis Date Noted   DOE (dyspnea on exertion) 01/14/2017   Headache 12/17/2016   Blood glucose elevated 12/17/2016   Encounter for screening for lipoid disorders 12/17/2016   Spherocytosis ([place]) 12/17/2016   Vitamin D deficiency 12/17/2016   Elevated bilirubin 01/09/2015   Pain in left shoulder 01/09/2015   Symptomatic cholelithiasis 04/14/2011   Anemia 04/14/2011    PCP: Leighton Ruff, MD  REFERRING PROVIDER: Inez Catalina, MD  REFERRING DIAG: [id number] (ICD-10-CM) - Pain in right knee  THERAPY DIAG:  Chronic pain of right knee  Difficulty in walking, not elsewhere classified  Muscle weakness (generalized)  Rationale for Evaluation and Treatment Rehabilitation  ONSET DATE: 2 months ago  SUBJECTIVE:   SUBJECTIVE STATEMENT: R knee pain and swelling has been an issue for 2 months with no MOI. Swelling and pain  increases after walking. Pt is using ice and elevation for symptom management. Pt notes a fall in Jan hitting the R knee, but the swelling went away.    PERTINENT HISTORY: Anemia  PAIN:  Are you having pain? Yes: NPRS scale: 5/10 Pain location: Ant/Lat knee and post/lateral R knee Pain description: ache and throb Aggravating factors: Walking, prolonged standing,  Relieving factors: Ice pack, sleeping with pillow underneath knee, biofreeze Pain range: 2-6/10  PRECAUTIONS: None  WEIGHT BEARING RESTRICTIONS No  FALLS:  Has patient fallen in last 6 months? No  LIVING ENVIRONMENT: Lives with: lives with their family Lives in: House/apartment No issue with accessing or mobility within home  OCCUPATION: Retired  PLOF: Independent  PATIENT GOALS Decrease the pain and  swelling of the R knee and learn to manage it   OBJECTIVE:   DIAGNOSTIC FINDINGS: Pt reports US revealed swelling and  Xrays were negative other than a little wear and tear c the cartilage. Diagnostics not available in Epic.  PATIENT SURVEYS:  FOTO 52% perceived function, predicted 65%  COGNITION:  Overall cognitive status: Within functional limits for tasks assessed     SENSATION: WFL  EDEMA:  Swelling R knee  MUSCLE LENGTH: Hamstrings: Right TBA deg; Left TBA deg   POSTURE:  Normal arches   PALPATION: TTP of the posterior and ant/lat R knee with associated swelling  LOWER EXTREMITY ROM:  Active ROM Right eval Left eval  Hip flexion 4 4  Hip extension 4 4  Hip abduction    Hip adduction 4 4  Hip internal rotation    Hip external rotation 4+ 4+  Knee flexion 4+ 4+  Knee extension 4+ 4+  Ankle dorsiflexion    Ankle plantarflexion    Ankle inversion    Ankle eversion     (Blank rows = not tested)  LOWER EXTREMITY MMT:  MMT Right eval Left eval  Hip flexion    Hip extension    Hip abduction    Hip adduction    Hip internal rotation    Hip external rotation    Knee flexion 135   Knee extension 0   Ankle dorsiflexion    Ankle plantarflexion    Ankle inversion    Ankle eversion     (Blank rows = not tested)  LOWER EXTREMITY SPECIAL TESTS:  Knee special tests: Patellafemoral apprehension test: positive  and Patellafemoral grind test: positive   FUNCTIONAL TESTS:  5 times sit to stand: TBA 2 minute walk test: TBA  GAIT: Distance walked: 259f Assistive device utilized: None Level of assistance: Complete Independence Comments: Min out toeing    TODAY'S TREATMENT: [facility] Adult PT Treatment:                                                DATE: 12/31/21 Therapeutic Exercise: SLR c quad set x5 SL hip abd x5 SL hip clam x5 Prone hip ext x5  Self Care: Symptom management- ice pack and elevation  PATIENT EDUCATION:  Education details: Eval  findings, POC, HEP, symptom management Person educated: Patient Education method: Explanation, Demonstration, Tactile cues, Verbal cues, and Handouts Education comprehension: verbalized understanding, returned demonstration, verbal cues required, and tactile cues required  HOME EXERCISE PROGRAM: [id number] ASSESSMENT:  CLINICAL IMPRESSION: Patient is a 66y.o. female who was seen today for physical therapy evaluation and treatment for R  knee pain. Posterior r knee pain consistent with a Baker's cyst.  OBJECTIVE IMPAIRMENTS decreased activity tolerance, difficulty walking, decreased strength, increased edema, and pain.   ACTIVITY LIMITATIONS carrying, lifting, bending, sitting, standing, squatting, stairs, transfers, and locomotion level  PARTICIPATION LIMITATIONS: meal prep, cleaning, laundry, shopping, community activity, and yard work  [place], Past/current experiences, and Time since onset of injury/illness/exacerbation are also affecting patient's functional outcome.   REHAB POTENTIAL: Good  CLINICAL DECISION MAKING: Stable/uncomplicated  EVALUATION COMPLEXITY: Low   GOALS:  SHORT TERM GOALS: Target date: 01/21/2022  Pt will be Ind in an initial HEP Baseline:started Goal status: INITIAL  2.  Pt will voice understanding of measures to assist with the reduction of R knee pain Baseline: started Goal status: INITIAL   LONG TERM GOALS: Target date: 02/20/22   Increase R hip strength to 4+ and knee strength to 5/5 for improved function with less pain Baseline: See flow sheets Goal status: INITIAL  2.  Pt will report a decrease in R knee pain to 3/10 or less with daily activities Baseline: 2-6/10 Goal status: INITIAL  3.  Pt will report being able to walk 1 mile with little difficulty Baseline: Quite a bit of difficuly Goal status: INITIAL  4.  FOTO perceived function score will improve to 65% Baseline: 52% Goal status: INITIAL  5.  5xSTS and 2MWT  will improved by the respective MCIDs's of 5 sec and 50 ft Baseline: TBA Goal status: INITIAL  6.  Pt will be ind in a final HEP to maintain achieved LOF Baseline: started Goal status: INITIAL   PLAN: PT FREQUENCY: 2x/week  PT DURATION: 6 weeks  PLANNED INTERVENTIONS: Therapeutic exercises, Therapeutic activity, Balance training, Gait training, Patient/Family education, Self Care, Joint mobilization, Stair training, Aquatic Therapy, Dry Needling, Electrical stimulation, Cryotherapy, Moist heat, Taping, Vasopneumatic device, Ultrasound, Ionotophoresis '4mg'$ /ml Dexamethasone, Manual therapy, and Re-evaluation  PLAN FOR NEXT SESSION: Review FOTO; assess response to HEP, progress therex as indicated, contact Dr. Sheppard Coil re: Baker's Cyst; use of modalities, manual therapy, TPDN as indicated.  Kahlea Cobert MS, PT 12/31/21 6:11 PM

## 2021-12-31 ENCOUNTER — Ambulatory Visit: Payer: Medicare Other | Attending: Sports Medicine

## 2021-12-31 ENCOUNTER — Other Ambulatory Visit: Payer: Self-pay

## 2021-12-31 DIAGNOSIS — R262 Difficulty in walking, not elsewhere classified: Secondary | ICD-10-CM | POA: Diagnosis not present

## 2021-12-31 DIAGNOSIS — M6281 Muscle weakness (generalized): Secondary | ICD-10-CM | POA: Diagnosis not present

## 2021-12-31 DIAGNOSIS — M25561 Pain in right knee: Secondary | ICD-10-CM | POA: Diagnosis not present

## 2021-12-31 DIAGNOSIS — G8929 Other chronic pain: Secondary | ICD-10-CM | POA: Diagnosis not present

## 2022-01-05 NOTE — Therapy (Unsigned)
OUTPATIENT PHYSICAL THERAPY TREATMENT NOTE   Patient Name: Carrie [name] MRN: [id number] DOB:06/09/1955, 66 y.o., female Today's Date: 01/06/2022  PCP: Leighton Ruff, MD  REFERRING PROVIDER: Inez Catalina, MD   END OF SESSION:   PT End of Session - 01/06/22 1029     Visit Number 2    Number of Visits 13    Date for PT Re-Evaluation 02/20/22    Authorization Type [employer] MEDICARE    PT Start Time 1020    PT Stop Time 1106    PT Time Calculation (min) 46 min    Activity Tolerance Patient tolerated treatment well    Behavior During Therapy WFL for tasks assessed/performed             Past Medical History:  Diagnosis Date   Allergy    Anemia    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   Anemia    Arthritis    back   Boil, breast    Bronchitis 3+yrs ago   Chronic back pain    hx of;pt lost weight and does yoga and back pain gone   Complication of anesthesia    increased Heart Rate after knee surgery   Cyst 1976   left arm    Eczema    Elevated bilirubin    secondary to spherospitic anemia   Fibroids 2009   embolization of fibroids   GERD (gastroesophageal reflux disease)    takes Omeprazole daily prn, history of   Headache    Heart murmur    History of bladder infections 25+yrs ago   used to see a urologist   History of IBS    Hyperlipidemia    Hypothyroidism    hx of 30+yrs ago;was on meds x couple of months and has been fine since   Knee pain    Pre-diabetes    Seasonal allergies    takes Allegra as needed as well as using Flonase   Vertigo    Past Surgical History:  Procedure Laterality Date   CHOLECYSTECTOMY  05/08/2011   Procedure: LAPAROSCOPIC CHOLECYSTECTOMY WITH INTRAOPERATIVE CHOLANGIOGRAM;  Surgeon: Harl Bowie, MD;  Location: [place];  Service: General;  Laterality: N/A;  LAPAROSCOPIC CHOLECYSTECTOMY WITH INTRAOPERATIVE CHOLANGIOGRAM   CHOLECYSTECTOMY     COLONOSCOPY     DILATATION & CURETTAGE/HYSTEROSCOPY WITH MYOSURE  N/A 02/18/2016   Procedure: [place];  Surgeon: Servando Salina, MD;  Location: [facility];  Service: Gynecology;  Laterality: N/A;   ENDOMETRIAL ABLATION  2011   KNEE SURGERY  15+yrs ago   left knee   KNEE SURGERY     TONSILLECTOMY     and adenoids as child   TUBAL LIGATION  1988   Patient Active Problem List   Diagnosis Date Noted   DOE (dyspnea on exertion) 01/14/2017   Headache 12/17/2016   Blood glucose elevated 12/17/2016   Encounter for screening for lipoid disorders 12/17/2016   Spherocytosis ([place]) 12/17/2016   Vitamin D deficiency 12/17/2016   Elevated bilirubin 01/09/2015   Pain in left shoulder 01/09/2015   Symptomatic cholelithiasis 04/14/2011   Anemia 04/14/2011    REFERRING DIAG:    THERAPY DIAG:  Chronic pain of right knee  Difficulty in walking, not elsewhere classified  Muscle weakness (generalized)  Rationale for Evaluation and Treatment Rehabilitation  PERTINENT HISTORY: see above   PRECAUTIONS: Bakers' Cyst  SUBJECTIVE: It is a little bit better.  I have not done much walking due to the heat.  The  exercises are good, I just cant do the one on my stomach.   PAIN:  Are you having pain? Yes: NPRS scale: 4/10 Pain location: R knee  Pain description: anterior , aching , sore  Aggravating factors: sit to stand, bending knee, walking  Relieving factors: ice, biofreeze, no routine OTC meds  After sit to stand 7/10   OBJECTIVE:    DIAGNOSTIC FINDINGS: Pt reports US revealed swelling and  Xrays were negative other than a little wear and tear c the cartilage. Diagnostics not available in Epic.   PATIENT SURVEYS:  FOTO 52% perceived function, predicted 65%   COGNITION:           Overall cognitive status: Within functional limits for tasks assessed                          SENSATION: WFL   EDEMA:  Swelling R knee   MUSCLE LENGTH: Hamstrings: Right TBA deg; Left TBA deg     POSTURE:  Normal arches     PALPATION: TTP of the posterior and ant/lat R knee with associated swelling   LOWER EXTREMITY ROM:   Active ROM Right eval Left eval  Hip flexion 4 4  Hip extension 4 4  Hip abduction      Hip adduction 4 4  Hip internal rotation      Hip external rotation 4+ 4+  Knee flexion 4+ 4+  Knee extension 4+ 4+  Ankle dorsiflexion      Ankle plantarflexion      Ankle inversion      Ankle eversion       (Blank rows = not tested)   LOWER EXTREMITY MMT:   MMT Right eval Left eval  Hip flexion      Hip extension      Hip abduction      Hip adduction      Hip internal rotation      Hip external rotation      Knee flexion 135    Knee extension 0    Ankle dorsiflexion      Ankle plantarflexion      Ankle inversion      Ankle eversion       (Blank rows = not tested)   LOWER EXTREMITY SPECIAL TESTS:  Knee special tests: Patellafemoral apprehension test: positive  and Patellafemoral grind test: positive    FUNCTIONAL TESTS:  5 times sit to stand: 26 sec, normal mat height, excessive lean forward with upper trunk, poor control on descent 30 sec STS  2 minute walk test: TBA   GAIT: Distance walked: 210f Assistive device utilized: None Level of assistance: Complete Independence Comments: Min out toeing       TODAY'S TREATMENT: [facility] Adult PT Treatment:                                                DATE: 01/06/22 Therapeutic Exercise: NuStep L5 UE and LE for 6 min  Sit to stand x 5 and then for 30 sec  Quad set  x10  SLR with Quad set x 10  SAQ x 10  Hip abduction 2 x 10  Hip extension prone DC: in S/L unable due to fatigue Clam x 10  Manual Therapy: Rocktape to Rt knee , 2 Y's supporting/surrounding patella Modalities: Cold pack 8  min Rt knee  Self Care: HEP modifications and recs   [facility] Adult PT Treatment:                                                DATE: 12/31/21 Therapeutic Exercise: SLR c quad set x5 SL hip abd x5 SL hip clam x5 Prone hip ext x5    Self Care: Symptom management- ice pack and elevation   PATIENT EDUCATION:  Education details: Eval findings, POC, HEP, symptom management Person educated: Patient Education method: Explanation, Demonstration, Tactile cues, Verbal cues, and Handouts Education comprehension: verbalized understanding, returned demonstration, verbal cues required, and tactile cues required   HOME EXERCISE PROGRAM: [id number]   ASSESSMENT:   CLINICAL IMPRESSION: Patient cancelled her appt with MD but should really try to get back in to have Bakers cyst looked at, possibly drained.  She had mod increase in pain with sit to stand and shows poor control, feet come up as she sits due to hesitancy to bend knees.  Modified her HEP to aid in back discomfort and substituted with bridge. Tape to Rt knee followed by ice to aid in pain.     OBJECTIVE IMPAIRMENTS decreased activity tolerance, difficulty walking, decreased strength, increased edema, and pain.    ACTIVITY LIMITATIONS carrying, lifting, bending, sitting, standing, squatting, stairs, transfers, and locomotion level   PARTICIPATION LIMITATIONS: meal prep, cleaning, laundry, shopping, community activity, and yard work   [place], Past/current experiences, and Time since onset of injury/illness/exacerbation are also affecting patient's functional outcome.    REHAB POTENTIAL: Good   CLINICAL DECISION MAKING: Stable/uncomplicated   EVALUATION COMPLEXITY: Low     GOALS:   SHORT TERM GOALS: Target date: 01/21/2022  Pt will be Ind in an initial HEP Baseline:started Goal status: INITIAL   2.  Pt will voice understanding of measures to assist with the reduction of R knee pain Baseline: started Goal status: INITIAL     LONG TERM GOALS: Target date: 02/20/22    Increase R hip strength to 4+ and knee strength to 5/5 for improved function with less pain Baseline: See flow sheets Goal status: INITIAL   2.  Pt will report a decrease in R  knee pain to 3/10 or less with daily activities Baseline: 2-6/10 Goal status: INITIAL   3.  Pt will report being able to walk 1 mile with little difficulty Baseline: Quite a bit of difficuly Goal status: INITIAL   4.  FOTO perceived function score will improve to 65% Baseline: 52% Goal status: INITIAL   5.  5xSTS and 2MWT will improved by the respective MCIDs's of 5 sec and 50 ft Baseline: TBA Goal status: INITIAL   6.  Pt will be ind in a final HEP to maintain achieved LOF Baseline: started Goal status: INITIAL     PLAN: PT FREQUENCY: 2x/week   PT DURATION: 6 weeks   PLANNED INTERVENTIONS: Therapeutic exercises, Therapeutic activity, Balance training, Gait training, Patient/Family education, Self Care, Joint mobilization, Stair training, Aquatic Therapy, Dry Needling, Electrical stimulation, Cryotherapy, Moist heat, Taping, Vasopneumatic device, Ultrasound, Ionotophoresis '4mg'$ /ml Dexamethasone, Manual therapy, and Re-evaluation   PLAN FOR NEXT SESSION: Review FOTO; assess response to HEP, progress therex as indicated, contact Dr. Sheppard Coil re: Baker's Cyst; use of modalities, manual therapy, TPDN as indicated.      Christopher Hink, PT 01/06/2022, 11:05 AM  Raeford Razor, PT 01/06/22 11:05 AM Phone: [phone] Fax: [phone]

## 2022-01-06 ENCOUNTER — Encounter: Payer: Self-pay | Admitting: Physical Therapy

## 2022-01-06 ENCOUNTER — Ambulatory Visit: Payer: Medicare Other | Admitting: Physical Therapy

## 2022-01-06 DIAGNOSIS — M25561 Pain in right knee: Secondary | ICD-10-CM | POA: Diagnosis not present

## 2022-01-06 DIAGNOSIS — R262 Difficulty in walking, not elsewhere classified: Secondary | ICD-10-CM | POA: Diagnosis not present

## 2022-01-06 DIAGNOSIS — M6281 Muscle weakness (generalized): Secondary | ICD-10-CM | POA: Diagnosis not present

## 2022-01-06 DIAGNOSIS — G8929 Other chronic pain: Secondary | ICD-10-CM | POA: Diagnosis not present

## 2022-01-07 DIAGNOSIS — R7303 Prediabetes: Secondary | ICD-10-CM | POA: Diagnosis not present

## 2022-01-07 DIAGNOSIS — M5451 Vertebrogenic low back pain: Secondary | ICD-10-CM | POA: Diagnosis not present

## 2022-01-07 DIAGNOSIS — E559 Vitamin D deficiency, unspecified: Secondary | ICD-10-CM | POA: Diagnosis not present

## 2022-01-07 DIAGNOSIS — R161 Splenomegaly, not elsewhere classified: Secondary | ICD-10-CM | POA: Diagnosis not present

## 2022-01-07 DIAGNOSIS — E049 Nontoxic goiter, unspecified: Secondary | ICD-10-CM | POA: Diagnosis not present

## 2022-01-08 ENCOUNTER — Ambulatory Visit: Payer: Medicare Other | Admitting: Physical Therapy

## 2022-01-13 NOTE — Therapy (Signed)
OUTPATIENT PHYSICAL THERAPY TREATMENT NOTE   Patient Name: Carrie [name] MRN: [id number] DOB:[birth year], 66 y.o., female Today's Date: 01/14/2022  PCP: Leighton Ruff, MD  REFERRING PROVIDER: Inez Catalina, MD   END OF SESSION:   PT End of Session - 01/14/22 2041     Visit Number 3    Number of Visits 13    Date for PT Re-Evaluation 02/20/22    Authorization Type [employer] MEDICARE    PT Start Time 7619    PT Stop Time 1509    PT Time Calculation (min) 49 min    Activity Tolerance Patient tolerated treatment well    Behavior During Therapy WFL for tasks assessed/performed              Past Medical History:  Diagnosis Date   Allergy    Anemia    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   Anemia    Arthritis    back   Boil, breast    Bronchitis 3+yrs ago   Chronic back pain    hx of;pt lost weight and does yoga and back pain gone   Complication of anesthesia    increased Heart Rate after knee surgery   Cyst 1976   left arm    Eczema    Elevated bilirubin    secondary to spherospitic anemia   Fibroids 2009   embolization of fibroids   GERD (gastroesophageal reflux disease)    takes Omeprazole daily prn, history of   Headache    Heart murmur    History of bladder infections 25+yrs ago   used to see a urologist   History of IBS    Hyperlipidemia    Hypothyroidism    hx of 30+yrs ago;was on meds x couple of months and has been fine since   Knee pain    Pre-diabetes    Seasonal allergies    takes Allegra as needed as well as using Flonase   Vertigo    Past Surgical History:  Procedure Laterality Date   CHOLECYSTECTOMY  05/08/2011   Procedure: LAPAROSCOPIC CHOLECYSTECTOMY WITH INTRAOPERATIVE CHOLANGIOGRAM;  Surgeon: Harl Bowie, MD;  Location: [place];  Service: General;  Laterality: N/A;  LAPAROSCOPIC CHOLECYSTECTOMY WITH INTRAOPERATIVE CHOLANGIOGRAM   CHOLECYSTECTOMY     COLONOSCOPY     DILATATION & CURETTAGE/HYSTEROSCOPY WITH  MYOSURE N/A 02/18/2016   Procedure: [place];  Surgeon: Servando Salina, MD;  Location: [facility];  Service: Gynecology;  Laterality: N/A;   ENDOMETRIAL ABLATION  2011   KNEE SURGERY  15+yrs ago   left knee   KNEE SURGERY     TONSILLECTOMY     and adenoids as child   TUBAL LIGATION  1988   Patient Active Problem List   Diagnosis Date Noted   DOE (dyspnea on exertion) 01/14/2017   Headache 12/17/2016   Blood glucose elevated 12/17/2016   Encounter for screening for lipoid disorders 12/17/2016   Spherocytosis ([place]) 12/17/2016   Vitamin D deficiency 12/17/2016   Elevated bilirubin 01/09/2015   Pain in left shoulder 01/09/2015   Symptomatic cholelithiasis 04/14/2011   Anemia 04/14/2011    REFERRING DIAG:    THERAPY DIAG:  Chronic pain of right knee  Difficulty in walking, not elsewhere classified  Muscle weakness (generalized)  Rationale for Evaluation and Treatment Rehabilitation  PERTINENT HISTORY: see above   PRECAUTIONS: Bakers' Cyst  SUBJECTIVE: Pt reports she has been helping an Aunt move and she has been having R knee pain at  night.  PAIN:  Are you having pain? Yes: NPRS scale: 3/10 Pain location: R knee  Pain description: anterior , aching , sore  Aggravating factors: sit to stand, bending knee, walking  Relieving factors: ice, biofreeze, no routine OTC meds  After sit to stand 7/10    OBJECTIVE:    DIAGNOSTIC FINDINGS: Pt reports US revealed swelling and  Xrays were negative other than a little wear and tear c the cartilage. Diagnostics not available in Epic.   PATIENT SURVEYS:  FOTO 52% perceived function, predicted 65%   COGNITION:           Overall cognitive status: Within functional limits for tasks assessed                          SENSATION: WFL   EDEMA:  Swelling R knee   MUSCLE LENGTH: Hamstrings: Right TBA deg; Left TBA deg     POSTURE:  Normal arches    PALPATION: TTP of the posterior and  ant/lat R knee with associated swelling   LOWER EXTREMITY ROM:   Active ROM Right eval Left eval  Hip flexion 4 4  Hip extension 4 4  Hip abduction      Hip adduction 4 4  Hip internal rotation      Hip external rotation 4+ 4+  Knee flexion 4+ 4+  Knee extension 4+ 4+  Ankle dorsiflexion      Ankle plantarflexion      Ankle inversion      Ankle eversion       (Blank rows = not tested)   LOWER EXTREMITY MMT:   MMT Right eval Left eval  Hip flexion      Hip extension      Hip abduction      Hip adduction      Hip internal rotation      Hip external rotation      Knee flexion 135    Knee extension 0    Ankle dorsiflexion      Ankle plantarflexion      Ankle inversion      Ankle eversion       (Blank rows = not tested)   LOWER EXTREMITY SPECIAL TESTS:  Knee special tests: Patellafemoral apprehension test: positive  and Patellafemoral grind test: positive    FUNCTIONAL TESTS:  5 times sit to stand: 26 sec, normal mat height, excessive lean forward with upper trunk, poor control on descent 30 sec STS  2 minute walk test: TBA   GAIT: Distance walked: 254f Assistive device utilized: None Level of assistance: Complete Independence Comments: Min out toeing       TODAY'S TREATMENT: [facility] Adult PT Treatment:                                                DATE: 01/14/22 NuStep L5 UE and LE for 6 min  Sit to stand x 5  Quad set  x10  SLR with Quad set 2 x 10  SAQ 2 x 10  Hip abduction 2 x 10  Clam 2 x 10 RTB Bridging 2x10  Manual Therapy: Rocktape to Rt knee , 2 I's supporting/surrounding patella  [facility] Adult PT Treatment:  DATE: 01/06/22 Therapeutic Exercise: NuStep L5 UE and LE for 6 min  Sit to stand x 5 and then for 30 sec  Quad set  x10  SLR with Quad set x 10  SAQ x 10  Hip abduction 2 x 10  Hip extension prone DC: in S/L unable due to fatigue Clam x 10   Manual Therapy: Rocktape to Rt knee , 2 Y's  supporting/surrounding patella Modalities: Cold pack 8 min Rt knee  Self Care: HEP modifications and recs   [facility] Adult PT Treatment:                                                DATE: 12/31/21 Therapeutic Exercise: SLR c quad set x5 SL hip abd x5 SL hip clam x5 Prone hip ext x5   Self Care: Symptom management- ice pack and elevation   PATIENT EDUCATION:  Education details: Eval findings, POC, HEP, symptom management Person educated: Patient Education method: Explanation, Demonstration, Tactile cues, Verbal cues, and Handouts Education comprehension: verbalized understanding, returned demonstration, verbal cues required, and tactile cues required   HOME EXERCISE PROGRAM: [id number]   ASSESSMENT:   CLINICAL IMPRESSION: Pt reports she has an appt scheduled with Dr. Sheppard Coil in mid Sept. Pt is having night pain due to increase demand and activity helping an aunt move. PT was completed for R knee/LE strengthening. Pt's R knee was then rock taped   for pain and stability. Pt walked with the tape on and stated her R knee felt good. Pt tolerated the session without adverse effects.   OBJECTIVE IMPAIRMENTS decreased activity tolerance, difficulty walking, decreased strength, increased edema, and pain.    ACTIVITY LIMITATIONS carrying, lifting, bending, sitting, standing, squatting, stairs, transfers, and locomotion level   PARTICIPATION LIMITATIONS: meal prep, cleaning, laundry, shopping, community activity, and yard work   [place], Past/current experiences, and Time since onset of injury/illness/exacerbation are also affecting patient's functional outcome.    REHAB POTENTIAL: Good   CLINICAL DECISION MAKING: Stable/uncomplicated   EVALUATION COMPLEXITY: Low     GOALS:   SHORT TERM GOALS: Target date: 01/21/2022  Pt will be Ind in an initial HEP Baseline:started Goal status: INITIAL   2.  Pt will voice understanding of measures to assist with the reduction  of R knee pain Baseline: started Goal status: INITIAL     LONG TERM GOALS: Target date: 02/20/22    Increase R hip strength to 4+ and knee strength to 5/5 for improved function with less pain Baseline: See flow sheets Goal status: INITIAL   2.  Pt will report a decrease in R knee pain to 3/10 or less with daily activities Baseline: 2-6/10 Goal status: INITIAL   3.  Pt will report being able to walk 1 mile with little difficulty Baseline: Quite a bit of difficuly Goal status: INITIAL   4.  FOTO perceived function score will improve to 65% Baseline: 52% Goal status: INITIAL   5.  5xSTS and 2MWT will improved by the respective MCIDs's of 5 sec and 50 ft Baseline: TBA Goal status: INITIAL   6.  Pt will be ind in a final HEP to maintain achieved LOF Baseline: started Goal status: INITIAL     PLAN: PT FREQUENCY: 2x/week   PT DURATION: 6 weeks   PLANNED INTERVENTIONS: Therapeutic exercises, Therapeutic activity, Balance training, Gait training, Patient/Family education,  Self Care, Joint mobilization, Stair training, Aquatic Therapy, Dry Needling, Electrical stimulation, Cryotherapy, Moist heat, Taping, Vasopneumatic device, Ultrasound, Ionotophoresis '4mg'$ /ml Dexamethasone, Manual therapy, and Re-evaluation   PLAN FOR NEXT SESSION: Review FOTO; assess response to HEP, progress therex as indicated, contact Dr. Sheppard Coil re: Baker's Cyst; use of modalities, manual therapy, TPDN as indicated.  Shakirah Kirkey MS, PT 01/14/22 9:08 PM

## 2022-01-14 ENCOUNTER — Ambulatory Visit: Payer: Medicare Other

## 2022-01-14 DIAGNOSIS — G8929 Other chronic pain: Secondary | ICD-10-CM | POA: Diagnosis not present

## 2022-01-14 DIAGNOSIS — R262 Difficulty in walking, not elsewhere classified: Secondary | ICD-10-CM | POA: Diagnosis not present

## 2022-01-14 DIAGNOSIS — M25561 Pain in right knee: Secondary | ICD-10-CM | POA: Diagnosis not present

## 2022-01-14 DIAGNOSIS — M6281 Muscle weakness (generalized): Secondary | ICD-10-CM | POA: Diagnosis not present

## 2022-01-16 ENCOUNTER — Encounter: Payer: Self-pay | Admitting: Physical Therapy

## 2022-01-16 ENCOUNTER — Ambulatory Visit: Payer: Medicare Other | Admitting: Physical Therapy

## 2022-01-16 DIAGNOSIS — G8929 Other chronic pain: Secondary | ICD-10-CM | POA: Diagnosis not present

## 2022-01-16 DIAGNOSIS — R262 Difficulty in walking, not elsewhere classified: Secondary | ICD-10-CM

## 2022-01-16 DIAGNOSIS — K760 Fatty (change of) liver, not elsewhere classified: Secondary | ICD-10-CM | POA: Diagnosis not present

## 2022-01-16 DIAGNOSIS — M25561 Pain in right knee: Secondary | ICD-10-CM | POA: Diagnosis not present

## 2022-01-16 DIAGNOSIS — M6281 Muscle weakness (generalized): Secondary | ICD-10-CM | POA: Diagnosis not present

## 2022-01-16 DIAGNOSIS — R161 Splenomegaly, not elsewhere classified: Secondary | ICD-10-CM | POA: Diagnosis not present

## 2022-01-16 NOTE — Therapy (Signed)
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   Anemia    Arthritis    back   Boil, breast    Bronchitis 3+yrs ago   Chronic back pain    hx of;pt lost weight and does yoga and back pain gone   Complication of anesthesia    increased Heart Rate after knee surgery   Cyst 1976   left arm    Eczema    Elevated bilirubin    secondary to spherospitic anemia   Fibroids 2009   embolization of fibroids   GERD (gastroesophageal reflux disease)    takes Omeprazole daily prn, history of   Headache    Heart murmur    History of bladder infections 25+yrs ago   used to see a urologist   History of IBS    Hyperlipidemia    Hypothyroidism    hx of 30+yrs ago;was on meds x couple of months and has been fine since   Knee pain    Pre-diabetes    Seasonal allergies    takes Allegra as needed as well as using Flonase   Vertigo    Past Surgical History:  Procedure Laterality Date   CHOLECYSTECTOMY  05/08/2011   Procedure: LAPAROSCOPIC CHOLECYSTECTOMY WITH INTRAOPERATIVE CHOLANGIOGRAM;  Surgeon: Harl Bowie, MD;  Location: [facility];  Service: General;  Laterality: N/A;  LAPAROSCOPIC CHOLECYSTECTOMY WITH INTRAOPERATIVE CHOLANGIOGRAM   CHOLECYSTECTOMY     COLONOSCOPY     DILATATION & CURETTAGE/HYSTEROSCOPY WITH MYOSURE N/A 02/18/2016   Procedure: DILATATION & CURETTAGE/HYSTEROSCOPY WITH MYOSURE;  Surgeon: Servando Salina, MD;   Location: [facility];  Service: Gynecology;  Laterality: N/A;   ENDOMETRIAL ABLATION  2011   KNEE SURGERY  15+yrs ago   left knee   KNEE SURGERY     TONSILLECTOMY     and adenoids as child   TUBAL LIGATION  1988   Patient Active Problem List   Diagnosis Date Noted   DOE (dyspnea on exertion) 01/14/2017   Headache 12/17/2016   Blood glucose elevated 12/17/2016   Encounter for screening for lipoid disorders 12/17/2016   Spherocytosis ([place]) 12/17/2016   Vitamin D deficiency 12/17/2016   Elevated bilirubin 01/09/2015   Pain in left shoulder 01/09/2015   Symptomatic cholelithiasis 04/14/2011   Anemia 04/14/2011    REFERRING DIAG:    THERAPY DIAG:  Chronic pain of right knee  Difficulty in walking, not elsewhere classified  Muscle weakness (generalized)  Rationale for Evaluation and Treatment Rehabilitation  PERTINENT HISTORY: see above   PRECAUTIONS: Bakers' Cyst  SUBJECTIVE: Pt reports she has been helping her Aunt move and she has been having R knee soreness last night.   PAIN:  Are you having pain? Yes: NPRS scale: 0/10 Pain location: R knee  Pain  description: anterior , aching , sore  Aggravating factors: sit to stand, bending knee, walking  Relieving factors: ice, biofreeze, no routine OTC meds  After sit to stand 7/10    OBJECTIVE:    DIAGNOSTIC FINDINGS: Pt reports US revealed swelling and  Xrays were negative other than a little wear and tear c the cartilage. Diagnostics not available in Epic.   PATIENT SURVEYS:  FOTO 52% perceived function, predicted 65%   COGNITION:           Overall cognitive status: Within functional limits for tasks assessed                          SENSATION: WFL   EDEMA:  Swelling R knee   MUSCLE LENGTH: Hamstrings: Right TBA deg; Left TBA deg     POSTURE:  Normal arches    PALPATION: TTP of the posterior and ant/lat R knee with associated swelling   LOWER EXTREMITY ROM:   Active ROM Right eval Left eval  Hip  flexion 4 4  Hip extension 4 4  Hip abduction      Hip adduction 4 4  Hip internal rotation      Hip external rotation 4+ 4+  Knee flexion 4+ 4+  Knee extension 4+ 4+  Ankle dorsiflexion      Ankle plantarflexion      Ankle inversion      Ankle eversion       (Blank rows = not tested)   LOWER EXTREMITY MMT:   MMT Right eval Left eval  Hip flexion      Hip extension      Hip abduction      Hip adduction      Hip internal rotation      Hip external rotation      Knee flexion 135    Knee extension 0    Ankle dorsiflexion      Ankle plantarflexion      Ankle inversion      Ankle eversion       (Blank rows = not tested)   LOWER EXTREMITY SPECIAL TESTS:  Knee special tests: Patellafemoral apprehension test: positive  and Patellafemoral grind test: positive    FUNCTIONAL TESTS:  5 times sit to stand: 26 sec, normal mat height, excessive lean forward with upper trunk, poor control on descent 30 sec STS  2 minute walk test: TBA   GAIT: Distance walked: 233f Assistive device utilized: None Level of assistance: Complete Independence Comments: Min out toeing       TODAY'S TREATMENT: [facility] Adult PT Treatment:                                                DATE: 01/16/22 NuStep L5 UE and LE for 6 min  Sit to stand x 10 from elevated Mat table  Bridging 2x10 Quad set  x15 R into towel roll R SLR with Quad set 2 x 10 (1 set 1# )  Hip abduction 2 x 10  SAQ 2 x 10 1#  Clam 2 x 10 RTB  Manual Therapy: Rocktape to Rt knee , 2 I's supporting/surrounding patella  [facility] Adult PT Treatment:  DATE: 01/14/22 NuStep L5 UE and LE for 6 min  Sit to stand x 5  Quad set  x10  SLR with Quad set 2 x 10  SAQ 2 x 10  Hip abduction 2 x 10  Clam 2 x 10 RTB Bridging 2x10  Manual Therapy: Rocktape to Rt knee , 2 I's supporting/surrounding patella  [facility] Adult PT Treatment:                                                DATE:  01/06/22 Therapeutic Exercise: NuStep L5 UE and LE for 6 min  Sit to stand x 5 and then for 30 sec  Quad set  x10  SLR with Quad set x 10  SAQ x 10  Hip abduction 2 x 10  Hip extension prone DC: in S/L unable due to fatigue Clam x 10   Manual Therapy: Rocktape to Rt knee , 2 Y's supporting/surrounding patella Modalities: Cold pack 8 min Rt knee  Self Care: HEP modifications and recs   [facility] Adult PT Treatment:                                                DATE: 12/31/21 Therapeutic Exercise: SLR c quad set x5 SL hip abd x5 SL hip clam x5 Prone hip ext x5   Self Care: Symptom management- ice pack and elevation   PATIENT EDUCATION:  Education details: Eval findings, POC, HEP, symptom management Person educated: Patient Education method: Explanation, Demonstration, Tactile cues, Verbal cues, and Handouts Education comprehension: verbalized understanding, returned demonstration, verbal cues required, and tactile cues required   HOME EXERCISE PROGRAM: Access Code: [email or web address] URL: [email or web address] Date: 01/16/2022 Prepared by: Hessie Diener  Exercises - Active Straight Leg Raise with Quad Set  - 1 x daily - 7 x weekly - 2 sets - 15-20 reps - 2 hold - Clamshell  - 1 x daily - 7 x weekly - 2 sets - 15-20 reps - 2 hold - Sidelying Hip Abduction  - 1 x daily - 7 x weekly - 2 sets - 15-20 reps - 2 hold - Supine Hamstring Stretch with Strap  - 1 x daily - 7 x weekly - 1 sets - 5 reps - 30 hold - Supine Bridge  - 1 x daily - 7 x weekly - 2 sets - 10 reps - 5 hold   ASSESSMENT:   CLINICAL IMPRESSION: Pt reports no pain today but did have soreness last evening after assisting Aunt with move to assisted living. Continued with targeted hip and knee strengthening for progression toward LTGs. Rock tape was reapplied as this was reported as helpful, although it came off quickly. Pt tolerated the session without adverse effects.   OBJECTIVE IMPAIRMENTS decreased  activity tolerance, difficulty walking, decreased strength, increased edema, and pain.    ACTIVITY LIMITATIONS carrying, lifting, bending, sitting, standing, squatting, stairs, transfers, and locomotion level   PARTICIPATION LIMITATIONS: meal prep, cleaning, laundry, shopping, community activity, and yard work   [place], Past/current experiences, and Time since onset of injury/illness/exacerbation are also affecting patient's functional outcome.    REHAB POTENTIAL: Good   CLINICAL DECISION MAKING: Stable/uncomplicated   EVALUATION COMPLEXITY: Low     GOALS:  SHORT TERM GOALS: Target date: 01/21/2022  Pt will be Ind in an initial HEP Baseline:started Goal status: INITIAL   2.  Pt will voice understanding of measures to assist with the reduction of R knee pain Baseline: started Goal status: INITIAL     LONG TERM GOALS: Target date: 02/20/22    Increase R hip strength to 4+ and knee strength to 5/5 for improved function with less pain Baseline: See flow sheets Goal status: INITIAL   2.  Pt will report a decrease in R knee pain to 3/10 or less with daily activities Baseline: 2-6/10 Goal status: INITIAL   3.  Pt will report being able to walk 1 mile with little difficulty Baseline: Quite a bit of difficuly Goal status: INITIAL   4.  FOTO perceived function score will improve to 65% Baseline: 52% Goal status: INITIAL   5.  5xSTS and 2MWT will improved by the respective MCIDs's of 5 sec and 50 ft Baseline: TBA Goal status: INITIAL   6.  Pt will be ind in a final HEP to maintain achieved LOF Baseline: started Goal status: INITIAL     PLAN: PT FREQUENCY: 2x/week   PT DURATION: 6 weeks   PLANNED INTERVENTIONS: Therapeutic exercises, Therapeutic activity, Balance training, Gait training, Patient/Family education, Self Care, Joint mobilization, Stair training, Aquatic Therapy, Dry Needling, Electrical stimulation, Cryotherapy, Moist heat, Taping,  Vasopneumatic device, Ultrasound, Ionotophoresis '4mg'$ /ml Dexamethasone, Manual therapy, and Re-evaluation   PLAN FOR NEXT SESSION: Review FOTO; assess response to HEP, progress therex as indicated, contact Dr. Sheppard Coil re: [facility]'s Cyst; use of modalities, manual therapy, TPDN as indicated.  Hessie Diener, PTA 01/16/22 11:45 AM Phone: [phone] Fax: [phone]

## 2022-01-17 NOTE — Therapy (Unsigned)
OUTPATIENT PHYSICAL THERAPY TREATMENT NOTE   Patient Name: Carrie [name] MRN: [id number] DOB:01-11-1956, 66 y.o., female Today's Date: 01/19/2022  PCP: Leighton Ruff, MD  REFERRING PROVIDER: Inez Catalina, MD   END OF SESSION:   PT End of Session - 01/19/22 1110     Visit Number 5    Number of Visits 13    Date for PT Re-Evaluation 02/20/22    Authorization Type [employer] MEDICARE    PT Start Time 1103    PT Stop Time 1148    PT Time Calculation (min) 45 min    Activity Tolerance Patient tolerated treatment well    Behavior During Therapy WFL for tasks assessed/performed               Past Medical History:  Diagnosis Date   Allergy    Anemia    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   Anemia    Arthritis    back   Boil, breast    Bronchitis 3+yrs ago   Chronic back pain    hx of;pt lost weight and does yoga and back pain gone   Complication of anesthesia    increased Heart Rate after knee surgery   Cyst 1976   left arm    Eczema    Elevated bilirubin    secondary to spherospitic anemia   Fibroids 2009   embolization of fibroids   GERD (gastroesophageal reflux disease)    takes Omeprazole daily prn, history of   Headache    Heart murmur    History of bladder infections 25+yrs ago   used to see a urologist   History of IBS    Hyperlipidemia    Hypothyroidism    hx of 30+yrs ago;was on meds x couple of months and has been fine since   Knee pain    Pre-diabetes    Seasonal allergies    takes Allegra as needed as well as using Flonase   Vertigo    Past Surgical History:  Procedure Laterality Date   CHOLECYSTECTOMY  05/08/2011   Procedure: LAPAROSCOPIC CHOLECYSTECTOMY WITH INTRAOPERATIVE CHOLANGIOGRAM;  Surgeon: Harl Bowie, MD;  Location: [facility];  Service: General;  Laterality: N/A;  LAPAROSCOPIC CHOLECYSTECTOMY WITH INTRAOPERATIVE CHOLANGIOGRAM   CHOLECYSTECTOMY     COLONOSCOPY     DILATATION & CURETTAGE/HYSTEROSCOPY WITH  MYOSURE N/A 02/18/2016   Procedure: [place];  Surgeon: Servando Salina, MD;  Location: [facility];  Service: Gynecology;  Laterality: N/A;   ENDOMETRIAL ABLATION  2011   KNEE SURGERY  15+yrs ago   left knee   KNEE SURGERY     TONSILLECTOMY     and adenoids as child   TUBAL LIGATION  1988   Patient Active Problem List   Diagnosis Date Noted   DOE (dyspnea on exertion) 01/14/2017   Headache 12/17/2016   Blood glucose elevated 12/17/2016   Encounter for screening for lipoid disorders 12/17/2016   Spherocytosis ([place]) 12/17/2016   Vitamin D deficiency 12/17/2016   Elevated bilirubin 01/09/2015   Pain in left shoulder 01/09/2015   Symptomatic cholelithiasis 04/14/2011   Anemia 04/14/2011    REFERRING DIAG:    THERAPY DIAG:  Chronic pain of right knee  Difficulty in walking, not elsewhere classified  Muscle weakness (generalized)  Rationale for Evaluation and Treatment Rehabilitation  PERTINENT HISTORY: see above   PRECAUTIONS: Bakers' Cyst  SUBJECTIVE:   Patient has no pain right now, other than some groin pain with the straight leg  raises.   PAIN:  Are you having pain? Yes: NPRS scale: 0/10 Pain location: R knee  Pain description: anterior , aching , sore  Aggravating factors: sit to stand, bending knee, walking  Relieving factors: ice, biofreeze, no routine OTC meds  After sit to stand 7/10    OBJECTIVE:    DIAGNOSTIC FINDINGS: Pt reports US revealed swelling and  Xrays were negative other than a little wear and tear c the cartilage. Diagnostics not available in Epic.   PATIENT SURVEYS:  FOTO 52% perceived function, predicted 65%   COGNITION:           Overall cognitive status: Within functional limits for tasks assessed                          SENSATION: WFL   EDEMA:  Swelling R knee   MUSCLE LENGTH: Hamstrings: Right TBA deg; Left TBA deg     POSTURE:  Normal arches    PALPATION: TTP of the posterior  and ant/lat R knee with associated swelling   LOWER EXTREMITY ROM:   Active ROM Right eval Left eval  Hip flexion 4 4  Hip extension 4 4  Hip abduction      Hip adduction 4 4  Hip internal rotation      Hip external rotation 4+ 4+  Knee flexion 4+ 4+  Knee extension 4+ 4+  Ankle dorsiflexion      Ankle plantarflexion      Ankle inversion      Ankle eversion       (Blank rows = not tested)   LOWER EXTREMITY MMT:   MMT Right eval Left eval  Hip flexion      Hip extension      Hip abduction      Hip adduction      Hip internal rotation      Hip external rotation      Knee flexion 135    Knee extension 0    Ankle dorsiflexion      Ankle plantarflexion      Ankle inversion      Ankle eversion       (Blank rows = not tested)   LOWER EXTREMITY SPECIAL TESTS:  Knee special tests: Patellafemoral apprehension test: positive  and Patellafemoral grind test: positive    FUNCTIONAL TESTS:  5 times sit to stand: 26 sec, normal mat height, excessive lean forward with upper trunk, poor control on descent 30 sec STS  2 minute walk test: TBA   GAIT: Distance walked: 274f Assistive device utilized: None Level of assistance: Complete Independence Comments: Min out toeing       TODAY'S TREATMENT:   [facility] Adult PT Treatment:                                                DATE: 01/19/22 Therapeutic Exercise: NuStep L 5 UE and LE for 5 min  Supine hip adduction ball squeeze x 10  Bridge with ball x 10  Rt hip flexor /Rt quad stretch off high mat table Knees wide with lower trunk rotation x 10  Sit to stand (seat on [employer]) with blue TB on thighs x 15 no UEs, decr. control  FW Step ups 4 inch without UE x 15 each side Lateral step ups 4 inch light UE  assist x 15 , cues for level pelvis  Hip abduction  Supine small ROM quad set to SLR x 10  S/L clam x 15 bilateral.  Hip abduction  x 10 bilateral Manual Therapy: Rocktape to Rt knee , 2 I's supporting/surrounding  patella   [facility] Adult PT Treatment:                                                DATE: 01/16/22 NuStep L5 UE and LE for 6 min  Sit to stand x 10 from elevated Mat table  Bridging 2x10 Quad set  x15 R into towel roll R SLR with Quad set 2 x 10 (1 set 1# )  Hip abduction 2 x 10  SAQ 2 x 10 1#  Clam 2 x 10 RTB Manual Therapy: Rocktape to Rt knee , 2 I's supporting/surrounding patella  [facility] Adult PT Treatment:                                                DATE: 01/14/22 NuStep L5 UE and LE for 6 min  Sit to stand x 5  Quad set  x10  SLR with Quad set 2 x 10  SAQ 2 x 10  Hip abduction 2 x 10  Clam 2 x 10 RTB Bridging 2x10  Manual Therapy: Rocktape to Rt knee , 2 I's supporting/surrounding patella  [facility] Adult PT Treatment:                                                DATE: 01/06/22 Therapeutic Exercise: NuStep L5 UE and LE for 6 min  Sit to stand x 5 and then for 30 sec  Quad set  x10  SLR with Quad set x 10  SAQ x 10  Hip abduction 2 x 10  Hip extension prone DC: in S/L unable due to fatigue Clam x 10   Manual Therapy: Rocktape to Rt knee , 2 Y's supporting/surrounding patella Modalities: Cold pack 8 min Rt knee  Self Care: HEP modifications and recs   [facility] Adult PT Treatment:                                                DATE: 12/31/21 Therapeutic Exercise: SLR c quad set x5 SL hip abd x5 SL hip clam x5 Prone hip ext x5   Self Care: Symptom management- ice pack and elevation   PATIENT EDUCATION:  Education details: Eval findings, POC, HEP, symptom management Person educated: Patient Education method: Explanation, Demonstration, Tactile cues, Verbal cues, and Handouts Education comprehension: verbalized understanding, returned demonstration, verbal cues required, and tactile cues required   HOME EXERCISE PROGRAM: Access Code: [email or web address] URL: [email or web address] Date: 01/16/2022 Prepared by: Hessie Diener  Exercises - Active Straight Leg  Raise with Quad Set  - 1 x daily - 7 x weekly - 2 sets - 15-20 reps - 2 hold - Clamshell  - 1 x daily - 7 x weekly -  2 sets - 15-20 reps - 2 hold - Sidelying Hip Abduction  - 1 x daily - 7 x weekly - 2 sets - 15-20 reps - 2 hold - Supine Hamstring Stretch with Strap  - 1 x daily - 7 x weekly - 1 sets - 5 reps - 30 hold - Supine Bridge  - 1 x daily - 7 x weekly - 2 sets - 10 reps - 5 hold   ASSESSMENT:   CLINICAL IMPRESSION: Pt able to do to more closed chain exercises today with good tolerance.  She shows decreased knee control with stand to sit and step downs.  Excessive trunk flexion/hinge with squatting.  Re-taped knee as it did provide light support. No complaint of hip flexor strain with SLR when upper body propped on elbows.    OBJECTIVE IMPAIRMENTS decreased activity tolerance, difficulty walking, decreased strength, increased edema, and pain.    ACTIVITY LIMITATIONS carrying, lifting, bending, sitting, standing, squatting, stairs, transfers, and locomotion level   PARTICIPATION LIMITATIONS: meal prep, cleaning, laundry, shopping, community activity, and yard work   [place], Past/current experiences, and Time since onset of injury/illness/exacerbation are also affecting patient's functional outcome.    REHAB POTENTIAL: Good   CLINICAL DECISION MAKING: Stable/uncomplicated   EVALUATION COMPLEXITY: Low     GOALS:   SHORT TERM GOALS: Target date: 01/21/2022  Pt will be Ind in an initial HEP Baseline:started Goal status: met   2.  Pt will voice understanding of measures to assist with the reduction of R knee pain Baseline: started Goal status: met     LONG TERM GOALS: Target date: 02/20/22    Increase R hip strength to 4+ and knee strength to 5/5 for improved function with less pain Baseline: See flow sheets Goal status: INITIAL   2.  Pt will report a decrease in R knee pain to 3/10 or less with daily activities Baseline: 2-6/10 Goal status: INITIAL    3.  Pt will report being able to walk 1 mile with little difficulty Baseline: Quite a bit of difficuly Goal status: INITIAL   4.  FOTO perceived function score will improve to 65% Baseline: 52% Goal status: INITIAL   5.  5xSTS and 2MWT will improved by the respective MCIDs's of 5 sec and 50 ft Baseline: TBA Goal status: INITIAL   6.  Pt will be ind in a final HEP to maintain achieved LOF Baseline: started Goal status: INITIAL     PLAN: PT FREQUENCY: 2x/week   PT DURATION: 6 weeks   PLANNED INTERVENTIONS: Therapeutic exercises, Therapeutic activity, Balance training, Gait training, Patient/Family education, Self Care, Joint mobilization, Stair training, Aquatic Therapy, Dry Needling, Electrical stimulation, Cryotherapy, Moist heat, Taping, Vasopneumatic device, Ultrasound, Ionotophoresis 81m/ml Dexamethasone, Manual therapy, and Re-evaluation   PLAN FOR NEXT SESSION: still has not contacted Dr. ASheppard Coilre: [facility] Cyst but it is better.  [facility] add to HEP? Hip and core. use of modalities, manual therapy, TPDN as indicated.  JRaeford Razor PT 01/19/22 12:02 PM Phone: [phone]: [phone]

## 2022-01-19 ENCOUNTER — Encounter: Payer: Self-pay | Admitting: Physical Therapy

## 2022-01-19 ENCOUNTER — Ambulatory Visit: Payer: Medicare Other | Admitting: Physical Therapy

## 2022-01-19 DIAGNOSIS — R262 Difficulty in walking, not elsewhere classified: Secondary | ICD-10-CM | POA: Diagnosis not present

## 2022-01-19 DIAGNOSIS — M25561 Pain in right knee: Secondary | ICD-10-CM | POA: Diagnosis not present

## 2022-01-19 DIAGNOSIS — G8929 Other chronic pain: Secondary | ICD-10-CM

## 2022-01-19 DIAGNOSIS — M6281 Muscle weakness (generalized): Secondary | ICD-10-CM | POA: Diagnosis not present

## 2022-01-20 NOTE — Therapy (Unsigned)
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   Anemia    Arthritis    back   Boil, breast    Bronchitis 3+yrs ago   Chronic back pain    hx of;pt lost weight and does yoga and back pain gone   Complication of anesthesia    increased Heart Rate after knee surgery   Cyst 1976   left arm    Eczema    Elevated bilirubin    secondary to spherospitic anemia   Fibroids 2009   embolization of fibroids   GERD (gastroesophageal reflux disease)    takes Omeprazole daily prn, history of   Headache    Heart murmur    History of bladder infections 25+yrs ago   used to see a urologist   History of IBS    Hyperlipidemia    Hypothyroidism    hx of 30+yrs ago;was on meds x couple of months and has been fine since   Knee pain    Pre-diabetes    Seasonal allergies    takes Allegra as needed as well as using Flonase   Vertigo    Past Surgical History:  Procedure Laterality Date   CHOLECYSTECTOMY  05/08/2011   Procedure: LAPAROSCOPIC CHOLECYSTECTOMY WITH INTRAOPERATIVE CHOLANGIOGRAM;  Surgeon: Harl Bowie, MD;  Location: [place];  Service: General;  Laterality: N/A;  LAPAROSCOPIC CHOLECYSTECTOMY WITH INTRAOPERATIVE CHOLANGIOGRAM   CHOLECYSTECTOMY     COLONOSCOPY     DILATATION & CURETTAGE/HYSTEROSCOPY WITH MYOSURE N/A 02/18/2016   Procedure: DILATATION & CURETTAGE/HYSTEROSCOPY WITH MYOSURE;  Surgeon: Servando Salina, MD;   Location: [facility];  Service: Gynecology;  Laterality: N/A;   ENDOMETRIAL ABLATION  2011   KNEE SURGERY  15+yrs ago   left knee   KNEE SURGERY     TONSILLECTOMY     and adenoids as child   TUBAL LIGATION  1988   Patient Active Problem List   Diagnosis Date Noted   DOE (dyspnea on exertion) 01/14/2017   Headache 12/17/2016   Blood glucose elevated 12/17/2016   Encounter for screening for lipoid disorders 12/17/2016   Spherocytosis ([place]) 12/17/2016   Vitamin D deficiency 12/17/2016   Elevated bilirubin 01/09/2015   Pain in left shoulder 01/09/2015   Symptomatic cholelithiasis 04/14/2011   Anemia 04/14/2011    REFERRING DIAG:    THERAPY DIAG:  Chronic pain of right knee  Difficulty in walking, not elsewhere classified  Muscle weakness (generalized)  Rationale for Evaluation and Treatment Rehabilitation  PERTINENT HISTORY: see above   PRECAUTIONS: Bakers' Cyst  SUBJECTIVE:   Very minor knee pain. Just discomfort.    PAIN:  Are you having pain? Yes: NPRS scale: 1/10 Pain location: R knee  Pain description: anterior , aching , sore  Aggravating  factors: sit to stand, bending knee, walking  Relieving factors: ice, biofreeze, no routine OTC meds  After sit to stand 7/10    OBJECTIVE:    DIAGNOSTIC FINDINGS: Pt reports US revealed swelling and  Xrays were negative other than a little wear and tear c the cartilage. Diagnostics not available in Epic.   PATIENT SURVEYS:  FOTO 52% perceived function, predicted 65% FOTO status: 56% 6th visit   COGNITION:           Overall cognitive status: Within functional limits for tasks assessed                          SENSATION: WFL   EDEMA:  Swelling R knee   MUSCLE LENGTH: Hamstrings: Right TBA deg; Left TBA deg     POSTURE:  Normal arches    PALPATION: TTP of the posterior and ant/lat R knee with associated swelling   LOWER EXTREMITY ROM:   Active ROM Right eval Left eval  Hip flexion 4 4  Hip extension 4  4  Hip abduction      Hip adduction 4 4  Hip internal rotation      Hip external rotation 4+ 4+  Knee flexion 4+ 4+  Knee extension 4+ 4+  Ankle dorsiflexion      Ankle plantarflexion      Ankle inversion      Ankle eversion       (Blank rows = not tested)   LOWER EXTREMITY MMT:   MMT Right eval Left eval  Hip flexion      Hip extension      Hip abduction      Hip adduction      Hip internal rotation      Hip external rotation      Knee flexion 135    Knee extension 0    Ankle dorsiflexion      Ankle plantarflexion      Ankle inversion      Ankle eversion       (Blank rows = not tested)   LOWER EXTREMITY SPECIAL TESTS:  Knee special tests: Patellafemoral apprehension test: positive  and Patellafemoral grind test: positive    FUNCTIONAL TESTS:  5 times sit to stand: 26 sec, normal mat height, excessive lean forward with upper trunk, poor control on descent 30 sec STS  2 minute walk test: TBA   GAIT: Distance walked: 227f Assistive device utilized: None Level of assistance: Complete Independence Comments: Min out toeing       TODAY'S TREATMENT: [facility] Adult PT Treatment:                                                DATE: 01/21/22 Therapeutic Exercise: NuStep L 5 UE and LE for 5 min  6 inch step up 10 x 2  6 inch lateral step up x 10  STS x 5 from mat, STS x 5 from Airex , STS x10 (blue band at knees) from double AIREX S/L clam x 15 bilateral Blue band  S/L hip abduction Blue band x 15 Bridge with ball x 10  Prone quad stretch with strap    Manual Therapy: Rocktape to Rt knee , 2 I's supporting/surrounding patella  [facility] Adult PT Treatment:  DATE: 01/19/22 Therapeutic Exercise: NuStep L 5 UE and LE for 5 min  Supine hip adduction ball squeeze x 10  Bridge with ball x 10  Rt hip flexor /Rt quad stretch off high mat table Knees wide with lower trunk rotation x 10  Sit to stand (seat on Airex) with blue TB on  thighs x 15 no UEs, decr. control  FW Step ups 4 inch without UE x 15 each side Lateral step ups 4 inch light UE assist x 15 , cues for level pelvis  Hip abduction  Supine small ROM quad set to SLR x 10  S/L clam x 15 bilateral.  Hip abduction  x 10 bilateral Manual Therapy: Rocktape to Rt knee , 2 I's supporting/surrounding patella   [facility] Adult PT Treatment:                                                DATE: 01/16/22 NuStep L5 UE and LE for 6 min  Sit to stand x 10 from elevated Mat table  Bridging 2x10 Quad set  x15 R into towel roll R SLR with Quad set 2 x 10 (1 set 1# )  Hip abduction 2 x 10  SAQ 2 x 10 1#  Clam 2 x 10 RTB Manual Therapy: Rocktape to Rt knee , 2 I's supporting/surrounding patella  [facility] Adult PT Treatment:                                                DATE: 01/14/22 NuStep L5 UE and LE for 6 min  Sit to stand x 5  Quad set  x10  SLR with Quad set 2 x 10  SAQ 2 x 10  Hip abduction 2 x 10  Clam 2 x 10 RTB Bridging 2x10  Manual Therapy: Rocktape to Rt knee , 2 I's supporting/surrounding patella  [facility] Adult PT Treatment:                                                DATE: 01/06/22 Therapeutic Exercise: NuStep L5 UE and LE for 6 min  Sit to stand x 5 and then for 30 sec  Quad set  x10  SLR with Quad set x 10  SAQ x 10  Hip abduction 2 x 10  Hip extension prone DC: in S/L unable due to fatigue Clam x 10   Manual Therapy: Rocktape to Rt knee , 2 Y's supporting/surrounding patella Modalities: Cold pack 8 min Rt knee  Self Care: HEP modifications and recs   [facility] Adult PT Treatment:                                                DATE: 12/31/21 Therapeutic Exercise: SLR c quad set x5 SL hip abd x5 SL hip clam x5 Prone hip ext x5   Self Care: Symptom management- ice pack and elevation   PATIENT EDUCATION:  Education details: Eval findings, POC, HEP, symptom  management Person educated: Patient Education method: Explanation, Demonstration,  Tactile cues, Verbal cues, and Handouts Education comprehension: verbalized understanding, returned demonstration, verbal cues required, and tactile cues required   HOME EXERCISE PROGRAM: Access Code: [email or web address] URL: [email or web address] Date: 01/16/2022 Prepared by: Hessie Diener  Exercises - Active Straight Leg Raise with Quad Set  - 1 x daily - 7 x weekly - 2 sets - 15-20 reps - 2 hold - Clamshell  - 1 x daily - 7 x weekly - 2 sets - 15-20 reps - 2 hold - Sidelying Hip Abduction  - 1 x daily - 7 x weekly - 2 sets - 15-20 reps - 2 hold - Supine Hamstring Stretch with Strap  - 1 x daily - 7 x weekly - 1 sets - 5 reps - 30 hold - Supine Bridge  - 1 x daily - 7 x weekly - 2 sets - 10 reps - 5 hold   ASSESSMENT:   CLINICAL IMPRESSION: Pt able to do to more closed chain exercises today with good tolerance, increased step height. Continued with hip and knee strength with good tolerance. Added elevation for STS due to her height.   Re-taped knee as it did provide light support. Added prone quad stretch with strap and she felt a good stretch.  OBJECTIVE IMPAIRMENTS decreased activity tolerance, difficulty walking, decreased strength, increased edema, and pain.    ACTIVITY LIMITATIONS carrying, lifting, bending, sitting, standing, squatting, stairs, transfers, and locomotion level   PARTICIPATION LIMITATIONS: meal prep, cleaning, laundry, shopping, community activity, and yard work   [place], Past/current experiences, and Time since onset of injury/illness/exacerbation are also affecting patient's functional outcome.    REHAB POTENTIAL: Good   CLINICAL DECISION MAKING: Stable/uncomplicated   EVALUATION COMPLEXITY: Low     GOALS:   SHORT TERM GOALS: Target date: 01/21/2022  Pt will be Ind in an initial HEP Baseline:started Goal status: met   2.  Pt will voice understanding of measures to assist with the reduction of R knee pain Baseline: started Goal  status: met     LONG TERM GOALS: Target date: 02/20/22    Increase R hip strength to 4+ and knee strength to 5/5 for improved function with less pain Baseline: See flow sheets Goal status: INITIAL   2.  Pt will report a decrease in R knee pain to 3/10 or less with daily activities Baseline: 2-6/10 Goal status: INITIAL   3.  Pt will report being able to walk 1 mile with little difficulty Baseline: Quite a bit of difficuly Goal status: INITIAL   4.  FOTO perceived function score will improve to 65% Baseline: 52% Goal status: INITIAL   5.  5xSTS and 2MWT will improved by the respective MCIDs's of 5 sec and 50 ft Baseline: TBA Goal status: INITIAL   6.  Pt will be ind in a final HEP to maintain achieved LOF Baseline: started Goal status: INITIAL     PLAN: PT FREQUENCY: 2x/week   PT DURATION: 6 weeks   PLANNED INTERVENTIONS: Therapeutic exercises, Therapeutic activity, Balance training, Gait training, Patient/Family education, Self Care, Joint mobilization, Stair training, Aquatic Therapy, Dry Needling, Electrical stimulation, Cryotherapy, Moist heat, Taping, Vasopneumatic device, Ultrasound, Ionotophoresis 4mg /ml Dexamethasone, Manual therapy, and Re-evaluation   PLAN FOR NEXT SESSION: still has not contacted Dr. Sheppard Coil re: Baker's Cyst but it is better.  [facility] add to HEP? Hip and core. use of modalities, manual therapy, TPDN as indicated.  Hessie Diener, PTA 01/21/22 12:46 PM Phone: [phone] Fax:  [phone]

## 2022-01-21 ENCOUNTER — Ambulatory Visit: Payer: Medicare Other | Admitting: Physical Therapy

## 2022-01-21 ENCOUNTER — Encounter: Payer: Self-pay | Admitting: Physical Therapy

## 2022-01-21 DIAGNOSIS — M6281 Muscle weakness (generalized): Secondary | ICD-10-CM

## 2022-01-21 DIAGNOSIS — G8929 Other chronic pain: Secondary | ICD-10-CM

## 2022-01-21 DIAGNOSIS — R262 Difficulty in walking, not elsewhere classified: Secondary | ICD-10-CM

## 2022-01-21 DIAGNOSIS — M25561 Pain in right knee: Secondary | ICD-10-CM | POA: Diagnosis not present

## 2022-01-28 ENCOUNTER — Ambulatory Visit: Payer: Medicare Other | Attending: Sports Medicine | Admitting: Physical Therapy

## 2022-01-28 ENCOUNTER — Encounter: Payer: Self-pay | Admitting: Physical Therapy

## 2022-01-28 DIAGNOSIS — M6281 Muscle weakness (generalized): Secondary | ICD-10-CM | POA: Diagnosis not present

## 2022-01-28 DIAGNOSIS — G8929 Other chronic pain: Secondary | ICD-10-CM | POA: Insufficient documentation

## 2022-01-28 DIAGNOSIS — M25561 Pain in right knee: Secondary | ICD-10-CM | POA: Diagnosis not present

## 2022-01-28 DIAGNOSIS — R262 Difficulty in walking, not elsewhere classified: Secondary | ICD-10-CM | POA: Insufficient documentation

## 2022-01-28 NOTE — Therapy (Signed)
OUTPATIENT PHYSICAL THERAPY TREATMENT NOTE   Patient Name: Carrie [name] MRN: [id number] DOB:01/13/1956, 66 y.o., female Today's Date: 01/28/2022  PCP: Elizabeth Barnes, MD  REFERRING PROVIDER: Kyle Alexander, MD   END OF SESSION:   PT End of Session - 01/28/22 1023     Visit Number 7    Number of Visits 13    Date for PT Re-Evaluation 02/20/22    Authorization Type [employer] MEDICARE    PT Start Time 1020    PT Stop Time 1058    PT Time Calculation (min) 38 min    Activity Tolerance Patient tolerated treatment well    Behavior During Therapy WFL for tasks assessed/performed                Past Medical History:  Diagnosis Date   Allergy    Anemia    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   Anemia    Arthritis    back   Boil, breast    Bronchitis 3+yrs ago   Chronic back pain    hx of;pt lost weight and does yoga and back pain gone   Complication of anesthesia    increased Heart Rate after knee surgery   Cyst 1976   left arm    Eczema    Elevated bilirubin    secondary to spherospitic anemia   Fibroids 2009   embolization of fibroids   GERD (gastroesophageal reflux disease)    takes Omeprazole daily prn, history of   Headache    Heart murmur    History of bladder infections 25+yrs ago   used to see a urologist   History of IBS    Hyperlipidemia    Hypothyroidism    hx of 30+yrs ago;was on meds x couple of months and has been fine since   Knee pain    Pre-diabetes    Seasonal allergies    takes Allegra as needed as well as using Flonase   Vertigo    Past Surgical History:  Procedure Laterality Date   CHOLECYSTECTOMY  05/08/2011   Procedure: LAPAROSCOPIC CHOLECYSTECTOMY WITH INTRAOPERATIVE CHOLANGIOGRAM;  Surgeon: Douglas A Blackman, MD;  Location: [facility];  Service: General;  Laterality: N/A;  LAPAROSCOPIC CHOLECYSTECTOMY WITH INTRAOPERATIVE CHOLANGIOGRAM   CHOLECYSTECTOMY     COLONOSCOPY     DILATATION & CURETTAGE/HYSTEROSCOPY WITH  MYOSURE N/A 02/18/2016   Procedure: DILATATION & CURETTAGE/HYSTEROSCOPY WITH MYOSURE;  Surgeon: Sheronette Cousins, MD;  Location: [facility];  Service: Gynecology;  Laterality: N/A;   ENDOMETRIAL ABLATION  2011   KNEE SURGERY  15+yrs ago   left knee   KNEE SURGERY     TONSILLECTOMY     and adenoids as child   TUBAL LIGATION  1988   Patient Active Problem List   Diagnosis Date Noted   DOE (dyspnea on exertion) 01/14/2017   Headache 12/17/2016   Blood glucose elevated 12/17/2016   Encounter for screening for lipoid disorders 12/17/2016   Spherocytosis ([facility]) 12/17/2016   Vitamin D deficiency 12/17/2016   Elevated bilirubin 01/09/2015   Pain in left shoulder 01/09/2015   Symptomatic cholelithiasis 04/14/2011   Anemia 04/14/2011    REFERRING DIAG:    THERAPY DIAG:  Chronic pain of right knee  Difficulty in walking, not elsewhere classified  Muscle weakness (generalized)  Rationale for Evaluation and Treatment Rehabilitation  PERTINENT HISTORY: see above   PRECAUTIONS: Bakers' Cyst  SUBJECTIVE:   My back went out.  I think I overdid it moving my aunt.    It is better than it was but Friday it was really bad.  Knee pain has been overshadowed by my back pain .     PAIN:  Are you having pain? Yes: NPRS scale: 1/10 Pain location: R knee  Pain description: anterior , aching , sore  Aggravating factors: sit to stand, bending knee, walking  Relieving factors: ice, biofreeze, no routine OTC meds      OBJECTIVE:    DIAGNOSTIC FINDINGS: Pt reports US revealed swelling and  Xrays were negative other than a little wear and tear c the cartilage. Diagnostics not available in Epic.   PATIENT SURVEYS:  FOTO 52% perceived function, predicted 65% FOTO status: 56% 6th visit   COGNITION:           Overall cognitive status: Within functional limits for tasks assessed                          SENSATION: WFL   EDEMA:  Swelling R knee   MUSCLE LENGTH: Hamstrings: Right TBA deg;  Left TBA deg     POSTURE:  Normal arches    PALPATION: TTP of the posterior and ant/lat R knee with associated swelling   LOWER EXTREMITY ROM:   Active ROM Right eval Left eval  Hip flexion 4 4  Hip extension 4 4  Hip abduction      Hip adduction 4 4  Hip internal rotation      Hip external rotation 4+ 4+  Knee flexion 4+ 4+  Knee extension 4+ 4+  Ankle dorsiflexion      Ankle plantarflexion      Ankle inversion      Ankle eversion       (Blank rows = not tested)   LOWER EXTREMITY MMT:   MMT Right eval Left eval  Hip flexion      Hip extension      Hip abduction      Hip adduction      Hip internal rotation      Hip external rotation      Knee flexion 135    Knee extension 0    Ankle dorsiflexion      Ankle plantarflexion      Ankle inversion      Ankle eversion       (Blank rows = not tested)   LOWER EXTREMITY SPECIAL TESTS:  Knee special tests: Patellafemoral apprehension test: positive  and Patellafemoral grind test: positive    FUNCTIONAL TESTS:  5 times sit to stand: 26 sec, normal mat height, excessive lean forward with upper trunk, poor control on descent 30 sec STS  2 minute walk test: TBA   GAIT: Distance walked: 272f Assistive device utilized: None Level of assistance: Complete Independence Comments: Min out toeing       TODAY'S TREATMENT:  [facility] Adult PT Treatment:                                                DATE: 01/28/22 Therapeutic Exercise: Supine knee to chest x 1, 60 sec Knee extension/flexion at 90 deg hip flexion Hamstring stretch 30 sec x 2 each   Abdominal activation with deep breathing , used ball for adductor squeeze Supine knee ext with ball squeeze alternating Partial bridge with ball, x 10  Sidelying clam x  15 LAQ  Rt LE 4 lbs x 15 leaned back for back  Partial ROM Wall slide with physioball for support in lumbar  x 15  Standing semi-tandem Palloff press x 10 each direction   Self Care: Tips for reducing back  pain: ice vs heat, 90/90 supine, sciatica vs lumbar vs hip pain , extension prone propping, piriformis stretch and stabilization.    [facility] Adult PT Treatment:                                                DATE: 01/21/22 Therapeutic Exercise: NuStep L 5 UE and LE for 5 min  6 inch step up 10 x 2  6 inch lateral step up x 10  STS x 5 from mat, STS x 5 from Airex , STS x10 (blue band at knees) from double AIREX S/L clam x 15 bilateral Blue band  S/L hip abduction Blue band x 15 Bridge with ball x 10  Prone quad stretch with strap    Manual Therapy: Rocktape to Rt knee , 2 I's supporting/surrounding patella  [facility] Adult PT Treatment:                                                DATE: 01/19/22 Therapeutic Exercise: NuStep L 5 UE and LE for 5 min  Supine hip adduction ball squeeze x 10  Bridge with ball x 10  Rt hip flexor /Rt quad stretch off high mat table Knees wide with lower trunk rotation x 10  Sit to stand (seat on Airex) with blue TB on thighs x 15 no UEs, decr. control  FW Step ups 4 inch without UE x 15 each side Lateral step ups 4 inch light UE assist x 15 , cues for level pelvis  Hip abduction  Supine small ROM quad set to SLR x 10  S/L clam x 15 bilateral.  Hip abduction  x 10 bilateral Manual Therapy: Rocktape to Rt knee , 2 I's supporting/surrounding patella     PATIENT EDUCATION:  Education details: Eval findings, POC, HEP, symptom management Person educated: Patient Education method: Explanation, Demonstration, Tactile cues, Verbal cues, and Handouts Education comprehension: verbalized understanding, returned demonstration, verbal cues required, and tactile cues required   HOME EXERCISE PROGRAM: Access Code: [email or web address] URL: [email or web address] Date: 01/16/2022 Prepared by: Jessica Donoho  Exercises - Active Straight Leg Raise with Quad Set  - 1 x daily - 7 x weekly - 2 sets - 15-20 reps - 2 hold - Clamshell  - 1 x daily - 7 x weekly - 2 sets -  15-20 reps - 2 hold - Sidelying Hip Abduction  - 1 x daily - 7 x weekly - 2 sets - 15-20 reps - 2 hold - Supine Hamstring Stretch with Strap  - 1 x daily - 7 x weekly - 1 sets - 5 reps - 30 hold - Supine Bridge  - 1 x daily - 7 x weekly - 2 sets - 10 reps - 5 hold   ASSESSMENT:   CLINICAL IMPRESSION: Patient with low back pain Rt side flare up to 7/10 when she walked in. Pain reduced with simple mat level exercises.  She exhibited no knee pain today, likely due to more focus   on her Rt hip, back.  She may ask Dr. Alexander about including her back Rt sided hip pain into her POC.  She is progressing well overall and will benefit from more PT in order to maximize functional outcomes.     OBJECTIVE IMPAIRMENTS decreased activity tolerance, difficulty walking, decreased strength, increased edema, and pain.    ACTIVITY LIMITATIONS carrying, lifting, bending, sitting, standing, squatting, stairs, transfers, and locomotion level   PARTICIPATION LIMITATIONS: meal prep, cleaning, laundry, shopping, community activity, and yard work   PERSONAL FACTORS [place], Past/current experiences, and Time since onset of injury/illness/exacerbation are also affecting patient's functional outcome.    REHAB POTENTIAL: Good   CLINICAL DECISION MAKING: Stable/uncomplicated   EVALUATION COMPLEXITY: Low     GOALS:   SHORT TERM GOALS: Target date: 01/21/2022  Pt will be Ind in an initial HEP Baseline:started Goal status: met   2.  Pt will voice understanding of measures to assist with the reduction of R knee pain Baseline: started Goal status: met     LONG TERM GOALS: Target date: 02/20/22    Increase R hip strength to 4+ and knee strength to 5/5 for improved function with less pain Baseline: See flow sheets Goal status: INITIAL   2.  Pt will report a decrease in R knee pain to 3/10 or less with daily activities Baseline: 2-6/10 Goal status: INITIAL   3.  Pt will report being able to walk 1 mile with  little difficulty Baseline: Quite a bit of difficuly Goal status: INITIAL   4.  FOTO perceived function score will improve to 65% Baseline: 52% Goal status: INITIAL   5.  5xSTS and 2MWT will improved by the respective MCIDs's of 5 sec and 50 ft Baseline: TBA Goal status: INITIAL   6.  Pt will be ind in a final HEP to maintain achieved LOF Baseline: started Goal status: INITIAL     PLAN: PT FREQUENCY: 2x/week   PT DURATION: 6 weeks   PLANNED INTERVENTIONS: Therapeutic exercises, Therapeutic activity, Balance training, Gait training, Patient/Family education, Self Care, Joint mobilization, Stair training, Aquatic Therapy, Dry Needling, Electrical stimulation, Cryotherapy, Moist heat, Taping, Vasopneumatic device, Ultrasound, Ionotophoresis 4mg/ml Dexamethasone, Manual therapy, and Re-evaluation   PLAN FOR NEXT SESSION: MD appt Baker's cyst? How is back? .  [facility] add to HEP? Hip and core. use of modalities, manual therapy, TPDN as indicated. Jennifer Paa, PT 01/28/22 11:06 AM Phone: [phone] Fax: [phone]  

## 2022-01-29 DIAGNOSIS — M545 Low back pain, unspecified: Secondary | ICD-10-CM | POA: Diagnosis not present

## 2022-02-02 NOTE — Therapy (Unsigned)
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   Anemia    Arthritis    back   Boil, breast    Bronchitis 3+yrs ago   Chronic back pain    hx of;pt lost weight and does yoga and back pain gone   Complication of anesthesia    increased Heart Rate after knee surgery   Cyst 1976   left arm    Eczema    Elevated bilirubin    secondary to spherospitic anemia   Fibroids 2009   embolization of fibroids   GERD (gastroesophageal reflux disease)    takes Omeprazole daily prn, history of   Headache    Heart murmur    History of bladder infections 25+yrs ago   used to see a urologist   History of IBS    Hyperlipidemia    Hypothyroidism    hx of 30+yrs ago;was on meds x couple of months and has been fine since   Knee pain    Pre-diabetes    Seasonal allergies    takes Allegra as needed as well as using Flonase   Vertigo    Past Surgical History:  Procedure Laterality Date   CHOLECYSTECTOMY  05/08/2011   Procedure: LAPAROSCOPIC CHOLECYSTECTOMY WITH INTRAOPERATIVE CHOLANGIOGRAM;  Surgeon: Harl Bowie, MD;  Location: [facility];  Service: General;  Laterality: N/A;  LAPAROSCOPIC CHOLECYSTECTOMY WITH INTRAOPERATIVE CHOLANGIOGRAM   CHOLECYSTECTOMY     COLONOSCOPY     DILATATION & CURETTAGE/HYSTEROSCOPY WITH MYOSURE N/A 02/18/2016   Procedure: DILATATION & CURETTAGE/HYSTEROSCOPY WITH MYOSURE;  Surgeon: Servando Salina,  MD;  Location: [facility];  Service: Gynecology;  Laterality: N/A;   ENDOMETRIAL ABLATION  2011   KNEE SURGERY  15+yrs ago   left knee   KNEE SURGERY     TONSILLECTOMY     and adenoids as child   TUBAL LIGATION  1988   Patient Active Problem List   Diagnosis Date Noted   DOE (dyspnea on exertion) 01/14/2017   Headache 12/17/2016   Blood glucose elevated 12/17/2016   Encounter for screening for lipoid disorders 12/17/2016   Spherocytosis ([place]) 12/17/2016   Vitamin D deficiency 12/17/2016   Elevated bilirubin 01/09/2015   Pain in left shoulder 01/09/2015   Symptomatic cholelithiasis 04/14/2011   Anemia 04/14/2011    REFERRING DIAG:    THERAPY DIAG:  Chronic pain of right knee  Difficulty in walking, not elsewhere classified  Muscle weakness (generalized)  Rationale for Evaluation and Treatment Rehabilitation  PERTINENT HISTORY: see above   PRECAUTIONS: Bakers' Cyst  SUBJECTIVE:   My back is getting better. I do the back stretches and all the exercises before I get out of bed. I have not felt any knee pain lately.  PAIN:  Are you having pain? Yes: NPRS scale: 0/10 Pain location: R knee  Pain description: anterior , aching , sore  Aggravating factors: sit to stand, bending knee, walking  Relieving factors: ice, biofreeze, no routine OTC meds  4/10 Right low back      OBJECTIVE:    DIAGNOSTIC FINDINGS: Pt reports US revealed swelling and  Xrays were negative other than a little wear and tear c the cartilage. Diagnostics not available in Epic.   PATIENT SURVEYS:  FOTO 52% perceived function, predicted 65% FOTO status: 56% 6th visit   COGNITION:           Overall cognitive status: Within functional limits for tasks assessed                          SENSATION: WFL   EDEMA:  Swelling R knee   MUSCLE LENGTH: Hamstrings: Right TBA deg; Left TBA deg     POSTURE:  Normal arches    PALPATION: TTP of the posterior and ant/lat R knee with associated  swelling   LOWER EXTREMITY ROM:   Active ROM Right eval Left eval  Hip flexion 4 4  Hip extension 4 4  Hip abduction      Hip adduction 4 4  Hip internal rotation      Hip external rotation 4+ 4+  Knee flexion 4+ 4+  Knee extension 4+ 4+  Ankle dorsiflexion      Ankle plantarflexion      Ankle inversion      Ankle eversion       (Blank rows = not tested)   LOWER EXTREMITY MMT:   MMT Right eval Left eval  Hip flexion      Hip extension      Hip abduction      Hip adduction      Hip internal rotation      Hip external rotation      Knee flexion 135    Knee extension 0    Ankle dorsiflexion      Ankle plantarflexion      Ankle inversion      Ankle eversion       (Blank rows = not tested)   LOWER EXTREMITY SPECIAL TESTS:  Knee special tests: Patellafemoral apprehension test: positive  and Patellafemoral grind test: positive    FUNCTIONAL TESTS:  5 times sit to stand: 26 sec, normal mat height, excessive lean forward with upper trunk, poor control on descent 30 sec STS  2 minute walk test: TBA   GAIT: Distance walked: 21f Assistive device utilized: None Level of assistance: Complete Independence Comments: Min out toeing       TODAY'S TREATMENT:   [facility] Adult PT Treatment:                                                DATE: 02/03/22 Therapeutic Exercise: Nustep L5 UE/LE x 5 minutes  6 inch step ups 2 x 10 each -c/o pain on left with left step up Left foot on step - right hip hike x 10  Partial ROM Wall slide with physioball for support in lumbar  x 15  SKTC LTR Bridge S/L clam green band   [facility] Adult PT Treatment:  DATE: 01/28/22 Therapeutic Exercise: Supine knee to chest x 1, 60 sec Knee extension/flexion at 90 deg hip flexion Hamstring stretch 30 sec x 2 each   Abdominal activation with deep breathing, used ball for adductor squeeze Supine knee ext with ball squeeze alternating Partial bridge with  ball, x 10  Sidelying clam x  15 LAQ Rt LE 4 lbs x 15 leaned back for back  Standing semi-tandem Palloff press x 10 each direction   Self Care: Tips for reducing back pain: ice vs heat, 90/90 supine, sciatica vs lumbar vs hip pain , extension prone propping, piriformis stretch and stabilization.    [facility] Adult PT Treatment:                                                DATE: 01/21/22 Therapeutic Exercise: NuStep L 5 UE and LE for 5 min  6 inch step up 10 x 2  6 inch lateral step up x 10  STS x 5 from mat, STS x 5 from Airex , STS x10 (blue band at knees) from double AIREX S/L clam x 15 bilateral Blue band  S/L hip abduction Blue band x 15 Bridge with ball x 10  Prone quad stretch with strap    Manual Therapy: Rocktape to Rt knee , 2 I's supporting/surrounding patella  [facility] Adult PT Treatment:                                                DATE: 01/19/22 Therapeutic Exercise: NuStep L 5 UE and LE for 5 min  Supine hip adduction ball squeeze x 10  Bridge with ball x 10  Rt hip flexor /Rt quad stretch off high mat table Knees wide with lower trunk rotation x 10  Sit to stand (seat on Airex) with blue TB on thighs x 15 no UEs, decr. control  FW Step ups 4 inch without UE x 15 each side Lateral step ups 4 inch light UE assist x 15 , cues for level pelvis  Hip abduction  Supine small ROM quad set to SLR x 10  S/L clam x 15 bilateral.  Hip abduction  x 10 bilateral Manual Therapy: Rocktape to Rt knee , 2 I's supporting/surrounding patella     PATIENT EDUCATION:  Education details: Eval findings, POC, HEP, symptom management Person educated: Patient Education method: Explanation, Demonstration, Tactile cues, Verbal cues, and Handouts Education comprehension: verbalized understanding, returned demonstration, verbal cues required, and tactile cues required   HOME EXERCISE PROGRAM: Access Code: [email or web address] URL: [email or web address] Date: 01/16/2022 Prepared by:  Hessie Diener  Exercises - Active Straight Leg Raise with Quad Set  - 1 x daily - 7 x weekly - 2 sets - 15-20 reps - 2 hold - Clamshell  - 1 x daily - 7 x weekly - 2 sets - 15-20 reps - 2 hold - Sidelying Hip Abduction  - 1 x daily - 7 x weekly - 2 sets - 15-20 reps - 2 hold - Supine Hamstring Stretch with Strap  - 1 x daily - 7 x weekly - 1 sets - 5 reps - 30 hold - Supine Bridge  - 1 x daily - 7 x weekly - 2 sets - 10 reps -  5 hold   ASSESSMENT:   CLINICAL IMPRESSION: Patient reports decreased back pain to 4/10 with basic back exercises and ice applications.  She reports no knee pain lately except discomfort during sit-stand. She was able to participate in closed chain knee strength without increased knee pain. She did have left low back pain with left step up. Noted min hip drop on right with left step up and she was given verbal cues to correct. Continued with core and LE strength in open chain as well as review of lumbar stretches.  She is progressing well overall and will benefit from more PT in order to maximize functional outcomes.  She has not been back to walking track to check walking tolerance.    OBJECTIVE IMPAIRMENTS decreased activity tolerance, difficulty walking, decreased strength, increased edema, and pain.    ACTIVITY LIMITATIONS carrying, lifting, bending, sitting, standing, squatting, stairs, transfers, and locomotion level   PARTICIPATION LIMITATIONS: meal prep, cleaning, laundry, shopping, community activity, and yard work   [place], Past/current experiences, and Time since onset of injury/illness/exacerbation are also affecting patient's functional outcome.    REHAB POTENTIAL: Good   CLINICAL DECISION MAKING: Stable/uncomplicated   EVALUATION COMPLEXITY: Low     GOALS:   SHORT TERM GOALS: Target date: 01/21/2022  Pt will be Ind in an initial HEP Baseline:started Goal status: met   2.  Pt will voice understanding of measures to assist with  the reduction of R knee pain Baseline: started Goal status: met     LONG TERM GOALS: Target date: 02/20/22    Increase R hip strength to 4+ and knee strength to 5/5 for improved function with less pain Baseline: See flow sheets Goal status: INITIAL   2.  Pt will report a decrease in R knee pain to 3/10 or less with daily activities Baseline: 2-6/10 Goal status: INITIAL   3.  Pt will report being able to walk 1 mile with little difficulty Baseline: Quite a bit of difficuly Status: 02/04/12:  Goal status: INITIAL   4.  FOTO perceived function score will improve to 65% Baseline: 52% Status: 56% 6th visit Goal status: ONGOING   5.  5xSTS and 2MWT will improved by the respective MCIDs's of 5 sec and 50 ft Baseline: TBA Goal status: INITIAL   6.  Pt will be ind in a final HEP to maintain achieved LOF Baseline: started Goal status: INITIAL     PLAN: PT FREQUENCY: 2x/week   PT DURATION: 6 weeks   PLANNED INTERVENTIONS: Therapeutic exercises, Therapeutic activity, Balance training, Gait training, Patient/Family education, Self Care, Joint mobilization, Stair training, Aquatic Therapy, Dry Needling, Electrical stimulation, Cryotherapy, Moist heat, Taping, Vasopneumatic device, Ultrasound, Ionotophoresis 2m/ml Dexamethasone, Manual therapy, and Re-evaluation   PLAN FOR NEXT SESSION: MD appt Baker's cyst? How is back?  Consider T.M. walking for goal check.  [facility] add to HEP? Hip and core. use of modalities, manual therapy, TPDN as indicated.   JRaeford Razor PT 02/03/22 1:26 PM Phone: [phone]: [phone]

## 2022-02-03 ENCOUNTER — Encounter: Payer: Self-pay | Admitting: Physical Therapy

## 2022-02-03 ENCOUNTER — Other Ambulatory Visit: Payer: Self-pay | Admitting: *Deleted

## 2022-02-03 ENCOUNTER — Ambulatory Visit: Payer: Medicare Other | Admitting: Physical Therapy

## 2022-02-03 DIAGNOSIS — E119 Type 2 diabetes mellitus without complications: Secondary | ICD-10-CM | POA: Diagnosis not present

## 2022-02-03 DIAGNOSIS — E042 Nontoxic multinodular goiter: Secondary | ICD-10-CM

## 2022-02-03 DIAGNOSIS — M6281 Muscle weakness (generalized): Secondary | ICD-10-CM

## 2022-02-03 DIAGNOSIS — R262 Difficulty in walking, not elsewhere classified: Secondary | ICD-10-CM

## 2022-02-03 DIAGNOSIS — H2513 Age-related nuclear cataract, bilateral: Secondary | ICD-10-CM | POA: Diagnosis not present

## 2022-02-03 DIAGNOSIS — G8929 Other chronic pain: Secondary | ICD-10-CM

## 2022-02-03 DIAGNOSIS — H1045 Other chronic allergic conjunctivitis: Secondary | ICD-10-CM | POA: Diagnosis not present

## 2022-02-03 DIAGNOSIS — Q141 Congenital malformation of retina: Secondary | ICD-10-CM | POA: Diagnosis not present

## 2022-02-03 DIAGNOSIS — H04123 Dry eye syndrome of bilateral lacrimal glands: Secondary | ICD-10-CM | POA: Diagnosis not present

## 2022-02-03 DIAGNOSIS — M25561 Pain in right knee: Secondary | ICD-10-CM | POA: Diagnosis not present

## 2022-02-04 DIAGNOSIS — M5441 Lumbago with sciatica, right side: Secondary | ICD-10-CM | POA: Diagnosis not present

## 2022-02-04 DIAGNOSIS — R161 Splenomegaly, not elsewhere classified: Secondary | ICD-10-CM | POA: Diagnosis not present

## 2022-02-06 ENCOUNTER — Ambulatory Visit: Payer: Medicare Other

## 2022-02-09 ENCOUNTER — Telehealth: Payer: Self-pay | Admitting: Hematology and Oncology

## 2022-02-09 NOTE — Telephone Encounter (Signed)
Scheduled appt per 9/14 referral. Pt is aware of appt date and time. Pt is aware to arrive 15 mins prior to appt time and to bring and updated insurance card. Pt is aware of appt location.   

## 2022-02-11 ENCOUNTER — Ambulatory Visit: Payer: Medicare Other | Admitting: Physical Therapy

## 2022-02-11 ENCOUNTER — Encounter: Payer: Self-pay | Admitting: Physical Therapy

## 2022-02-11 DIAGNOSIS — G8929 Other chronic pain: Secondary | ICD-10-CM

## 2022-02-11 DIAGNOSIS — M6281 Muscle weakness (generalized): Secondary | ICD-10-CM

## 2022-02-11 DIAGNOSIS — M25561 Pain in right knee: Secondary | ICD-10-CM | POA: Diagnosis not present

## 2022-02-11 DIAGNOSIS — R262 Difficulty in walking, not elsewhere classified: Secondary | ICD-10-CM

## 2022-02-11 NOTE — Therapy (Addendum)
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   Anemia    Arthritis    back   Boil, breast    Bronchitis 3+yrs ago   Chronic back pain    hx of;pt lost weight and does yoga and back pain gone   Complication of anesthesia    increased Heart Rate after knee surgery   Cyst 1976   left arm    Eczema    Elevated bilirubin    secondary to spherospitic anemia   Fibroids 2009   embolization of fibroids   GERD (gastroesophageal reflux disease)    takes Omeprazole daily prn, history of   Headache    Heart murmur    History of bladder infections 25+yrs ago   used to see a urologist   History of IBS    Hyperlipidemia    Hypothyroidism    hx of 30+yrs ago;was on meds x couple of months and has been fine since   Knee pain    Pre-diabetes    Seasonal allergies    takes Allegra as needed as well as using Flonase   Vertigo    Past Surgical History:  Procedure Laterality Date   CHOLECYSTECTOMY  05/08/2011   Procedure: LAPAROSCOPIC CHOLECYSTECTOMY WITH INTRAOPERATIVE CHOLANGIOGRAM;  Surgeon: Harl Bowie, MD;  Location: [facility];  Service: General;  Laterality: N/A;  LAPAROSCOPIC CHOLECYSTECTOMY WITH INTRAOPERATIVE CHOLANGIOGRAM   CHOLECYSTECTOMY     COLONOSCOPY     DILATATION & CURETTAGE/HYSTEROSCOPY WITH MYOSURE N/A 02/18/2016   Procedure: DILATATION & CURETTAGE/HYSTEROSCOPY WITH MYOSURE;  Surgeon: Servando Salina, MD;  Location: [facility];  Service: Gynecology;  Laterality: N/A;   ENDOMETRIAL ABLATION  2011   KNEE SURGERY  15+yrs ago   left knee   KNEE SURGERY     TONSILLECTOMY     and adenoids as child   TUBAL LIGATION  1988   Patient Active Problem List   Diagnosis Date Noted   DOE (dyspnea on exertion) 01/14/2017   Headache 12/17/2016   Blood glucose elevated 12/17/2016   Encounter for screening for lipoid disorders 12/17/2016   Spherocytosis ([place]) 12/17/2016   Vitamin D deficiency 12/17/2016   Elevated bilirubin 01/09/2015   Pain in left shoulder 01/09/2015   Symptomatic cholelithiasis 04/14/2011   Anemia 04/14/2011    REFERRING DIAG:    THERAPY DIAG:  Chronic pain of right knee  Difficulty in walking, not elsewhere classified  Muscle weakness (generalized)  Rationale for Evaluation and Treatment Rehabilitation  PERTINENT HISTORY: see above   PRECAUTIONS: Bakers' Cyst  SUBJECTIVE:   I am doing much better. The back is better too. Am I ready to graduate?  PAIN:  Are you having pain? Yes: NPRS scale: 0/10 Pain location: R knee  Pain  description: anterior , aching , sore  Aggravating factors: sit to stand, bending knee, walking  Relieving factors: ice, biofreeze, no routine OTC meds  0/10 Right low back      OBJECTIVE:    DIAGNOSTIC FINDINGS: Pt reports US revealed swelling and  Xrays were negative other than a little wear and tear c the cartilage. Diagnostics not available in Epic.   PATIENT SURVEYS:  FOTO 52% perceived function, predicted 65% FOTO status: 56% 6th visit FOTO:  Discharge: 77% 9th viist   COGNITION:           Overall cognitive status: Within functional limits for tasks assessed                          SENSATION: WFL   EDEMA:  Swelling R knee   MUSCLE LENGTH: Hamstrings: Right TBA deg; Left TBA deg     POSTURE:  Normal arches    PALPATION: TTP of the posterior and ant/lat R knee with associated swelling   LOWER EXTREMITY  ROM:   Active ROM Right eval Left eval Right 02/11/22 Left 02/11/22  Hip flexion 4 4 4+ 4+  Hip extension 4 4    Hip abduction     4+ 5  Hip adduction 4 4    Hip internal rotation        Hip external rotation 4+ 4+    Knee flexion 4+ 4+ 5 5  Knee extension 4+ 4+ 5 5  Ankle dorsiflexion        Ankle plantarflexion        Ankle inversion        Ankle eversion         (Blank rows = not tested)   LOWER EXTREMITY MMT:   MMT Right eval Left eval  Hip flexion      Hip extension      Hip abduction      Hip adduction      Hip internal rotation      Hip external rotation      Knee flexion 135    Knee extension 0    Ankle dorsiflexion      Ankle plantarflexion      Ankle inversion      Ankle eversion       (Blank rows = not tested)   LOWER EXTREMITY SPECIAL TESTS:  Knee special tests: Patellafemoral apprehension test: positive  and Patellafemoral grind test: positive    FUNCTIONAL TESTS:  5 times sit to stand: 26 sec, normal mat height, excessive lean forward with upper trunk, poor control on descent 5 x STS 02/11/22: 14.7 sec  30 sec STS  2 minute walk test: TBA: 9/20/22023 2MWT: 598 feet ( Target of 509)    GAIT: Distance walked: 266f Assistive device utilized: None Level of assistance: Complete Independence Comments: Min out toeing       TODAY'S TREATMENT: [facility] Adult PT Treatment:                                                DATE: 02/11/22 Therapeutic Exercise: Nustep L5 LE only while discussing progress  2 MWT: 509 Feet 5 x STS 14.7 sec  FOTO  Step ups x 15 each Wall squats with exercise ball x 15 Left SLS 21 sec, 8 sec Right   [facility] Adult PT  Treatment:                                                DATE: 02/03/22 Therapeutic Exercise: Nustep L5 UE/LE x 5 minutes  6 inch step ups 2 x 10 each -c/o pain on left with left step up Left foot on step - right hip hike x 10  Partial ROM Wall slide with physioball for support in lumbar  x 15   SKTC LTR Bridge S/L clam green band   [facility] Adult PT Treatment:                                                DATE: 01/28/22 Therapeutic Exercise: Supine knee to chest x 1, 60 sec Knee extension/flexion at 90 deg hip flexion Hamstring stretch 30 sec x 2 each   Abdominal activation with deep breathing, used ball for adductor squeeze Supine knee ext with ball squeeze alternating Partial bridge with ball, x 10  Sidelying clam x  15 LAQ Rt LE 4 lbs x 15 leaned back for back  Standing semi-tandem Palloff press x 10 each direction   Self Care: Tips for reducing back pain: ice vs heat, 90/90 supine, sciatica vs lumbar vs hip pain , extension prone propping, piriformis stretch and stabilization.    [facility] Adult PT Treatment:                                                DATE: 01/21/22 Therapeutic Exercise: NuStep L 5 UE and LE for 5 min  6 inch step up 10 x 2  6 inch lateral step up x 10  STS x 5 from mat, STS x 5 from Airex , STS x10 (blue band at knees) from double AIREX S/L clam x 15 bilateral Blue band  S/L hip abduction Blue band x 15 Bridge with ball x 10  Prone quad stretch with strap    Manual Therapy: Rocktape to Rt knee , 2 I's supporting/surrounding patella  [facility] Adult PT Treatment:                                                DATE: 01/19/22 Therapeutic Exercise: NuStep L 5 UE and LE for 5 min  Supine hip adduction ball squeeze x 10  Bridge with ball x 10  Rt hip flexor /Rt quad stretch off high mat table Knees wide with lower trunk rotation x 10  Sit to stand (seat on Airex) with blue TB on thighs x 15 no UEs, decr. control  FW Step ups 4 inch without UE x 15 each side Lateral step ups 4 inch light UE assist x 15 , cues for level pelvis  Hip abduction  Supine small ROM quad set to SLR x 10  S/L clam x 15 bilateral.  Hip abduction  x 10 bilateral Manual Therapy: Rocktape to Rt knee , 2 I's supporting/surrounding patella     PATIENT EDUCATION:  Education  details:  Eval findings, POC, HEP, symptom management Person educated: Patient Education method: Explanation, Demonstration, Tactile cues, Verbal cues, and Handouts Education comprehension: verbalized understanding, returned demonstration, verbal cues required, and tactile cues required   HOME EXERCISE PROGRAM: Access Code: [email or web address] URL: [email or web address] Date: 01/16/2022 Prepared by: Hessie Diener  Exercises - Active Straight Leg Raise with Quad Set  - 1 x daily - 7 x weekly - 2 sets - 15-20 reps - 2 hold - Clamshell  - 1 x daily - 7 x weekly - 2 sets - 15-20 reps - 2 hold - Sidelying Hip Abduction  - 1 x daily - 7 x weekly - 2 sets - 15-20 reps - 2 hold - Supine Hamstring Stretch with Strap  - 1 x daily - 7 x weekly - 1 sets - 5 reps - 30 hold - Supine Bridge  - 1 x daily - 7 x weekly - 2 sets - 10 reps - 5 hold   ASSESSMENT:   CLINICAL IMPRESSION: Patient reports significant improvement in back and knee pain and arrives asking if she can be discharged. She is pleased with current LOF and is independent with HEP. FOTO score improved beyond prediction. Her 5 x STS has improved and 2MWT is above level for age. She has met all LTGS at this time and will be discharge to HEP today.      OBJECTIVE IMPAIRMENTS decreased activity tolerance, difficulty walking, decreased strength, increased edema, and pain.    ACTIVITY LIMITATIONS carrying, lifting, bending, sitting, standing, squatting, stairs, transfers, and locomotion level   PARTICIPATION LIMITATIONS: meal prep, cleaning, laundry, shopping, community activity, and yard work   [place], Past/current experiences, and Time since onset of injury/illness/exacerbation are also affecting patient's functional outcome.    REHAB POTENTIAL: Good   CLINICAL DECISION MAKING: Stable/uncomplicated   EVALUATION COMPLEXITY: Low     GOALS:   SHORT TERM GOALS: Target date: 01/21/2022  Pt will be Ind in an initial  HEP Baseline:started Goal status: met   2.  Pt will voice understanding of measures to assist with the reduction of R knee pain Baseline: started Goal status: met     LONG TERM GOALS: Target date: 02/20/22    Increase R hip strength to 4+ and knee strength to 5/5 for improved function with less pain Baseline: See flow sheets Goal status: MET   2.  Pt will report a decrease in R knee pain to 3/10 or less with daily activities Baseline: 2-6/10 Status: 02/11/22 : discomfort intermittently, no pain Goal status: MET   3.  Pt will report being able to walk 1 mile with little difficulty Baseline: Quite a bit of difficuly Status: 02/12/12: No difficulty Goal status: MET    4.  FOTO perceived function score will improve to 65% Baseline: 52% Status: 56% 6th visit Status: 77% 9th visit Goal status: MET   5.  5xSTS and 2MWT will improved by the respective MCIDs's of 5 sec and 50 ft  Baseline: see above functional tests Status: 598 feet ( target 509 feet  for age)  Goal status: MET   6.  Pt will be ind in a final HEP to maintain achieved LOF Baseline: started Goal status: MET     PLAN: PT FREQUENCY: 2x/week   PT DURATION: 6 weeks   PLANNED INTERVENTIONS: Therapeutic exercises, Therapeutic activity, Balance training, Gait training, Patient/Family education, Self Care, Joint mobilization, Stair training, Aquatic Therapy, Dry Needling, Electrical stimulation, Cryotherapy, Moist heat, Taping, Vasopneumatic device, Ultrasound, Ionotophoresis 65m/ml Dexamethasone,  Manual therapy, and Re-evaluation   PLAN FOR NEXT SESSION: N/A discharge to HEP today  Hessie Diener, PTA 02/11/22 1:18 PM Phone: [phone] Fax: [phone]   PHYSICAL THERAPY DISCHARGE SUMMARY  Visits from Start of Care: 9  Current functional level related to goals / functional outcomes: See clinical impression and PT goals    Remaining deficits: See clinical impression and PT goals    Education /  Equipment: HEP   Patient agrees to discharge. Patient goals were met. Patient is being discharged due to meeting the stated rehab goals.  Allen Ralls MS, PT 03/05/22 9:15 AM

## 2022-02-13 ENCOUNTER — Ambulatory Visit: Payer: Medicare Other | Admitting: Physical Therapy

## 2022-02-13 DIAGNOSIS — M8580 Other specified disorders of bone density and structure, unspecified site: Secondary | ICD-10-CM | POA: Diagnosis not present

## 2022-02-13 DIAGNOSIS — R300 Dysuria: Secondary | ICD-10-CM | POA: Diagnosis not present

## 2022-02-13 DIAGNOSIS — Z9189 Other specified personal risk factors, not elsewhere classified: Secondary | ICD-10-CM | POA: Diagnosis not present

## 2022-02-16 ENCOUNTER — Ambulatory Visit: Payer: Medicare Other | Admitting: Physical Therapy

## 2022-02-16 ENCOUNTER — Ambulatory Visit
Admission: RE | Admit: 2022-02-16 | Discharge: 2022-02-16 | Disposition: A | Discharge status: Home / Self Care | Payer: Medicare Other | Source: Ambulatory Visit | Attending: *Deleted | Admitting: *Deleted

## 2022-02-16 DIAGNOSIS — E042 Nontoxic multinodular goiter: Secondary | ICD-10-CM

## 2022-02-18 ENCOUNTER — Ambulatory Visit: Payer: Medicare Other

## 2022-02-19 ENCOUNTER — Ambulatory Visit
Admission: EM | Admit: 2022-02-19 | Discharge: 2022-02-19 | Disposition: A | Discharge status: Home / Self Care | Payer: Medicare Other | Attending: Physician Assistant | Admitting: Physician Assistant

## 2022-02-19 ENCOUNTER — Encounter: Payer: Self-pay | Admitting: Emergency Medicine

## 2022-02-19 DIAGNOSIS — R051 Acute cough: Secondary | ICD-10-CM | POA: Diagnosis not present

## 2022-02-19 DIAGNOSIS — Z1152 Encounter for screening for COVID-19: Secondary | ICD-10-CM | POA: Insufficient documentation

## 2022-02-19 DIAGNOSIS — J069 Acute upper respiratory infection, unspecified: Secondary | ICD-10-CM | POA: Diagnosis not present

## 2022-02-19 DIAGNOSIS — J22 Unspecified acute lower respiratory infection: Secondary | ICD-10-CM | POA: Diagnosis not present

## 2022-02-19 LAB — RESP PANEL BY RT-PCR (FLU A&B, COVID) ARPGX2
Influenza A by PCR: NEGATIVE
Influenza B by PCR: NEGATIVE
SARS Coronavirus 2 by RT PCR: POSITIVE — AB

## 2022-02-19 MED ORDER — DM-GUAIFENESIN ER 30-600 MG PO TB12: 1.0000 | ORAL_TABLET | Freq: Two times a day (BID) | ORAL | 0 refills | Status: DC

## 2022-02-19 NOTE — ED Triage Notes (Signed)
Pt is present today with c/o chills, HA, fever, and cough. Pt sx started monday

## 2022-02-19 NOTE — ED Provider Notes (Signed)
[name]    CSN: [id number] Arrival date & time: 02/19/22  1347      History   Chief Complaint Chief Complaint  Patient presents with   Fever   Cough    HPI Carrie [name] is a 66 y.o. female.   66 year old female presents with cough, congestion.  Patient indicates that on Sunday she started having cough, chest congestion, production which is yellow and clear.  She relates that she also has been having upper respiratory congestion with rhinitis, postnasal drip, sore throat and painful swallowing which resolved 2 days ago.  She indicates that she has had fever which has been intermittent at 100.  She also indicates she is having some fatigue, chills, body aches and pains.  She indicates she has not been around any family or friends that have been sick.  She indicates she has been vaccinated against COVID.  She is taken some Tylenol with mild relief of her symptoms.   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   Anemia    Arthritis    back   Boil, breast    Bronchitis 3+yrs ago   Chronic back pain    hx of;pt lost weight and does yoga and back pain gone   Complication of anesthesia    increased Heart Rate after knee surgery   Cyst 1976   left arm    Eczema    Elevated bilirubin    secondary to spherospitic anemia   Fibroids 2009   embolization of fibroids   GERD (gastroesophageal reflux disease)    takes Omeprazole daily prn, history of   Headache    Heart murmur    History of bladder infections 25+yrs ago   used to see a urologist   History of IBS    Hyperlipidemia    Hypothyroidism    hx of 30+yrs ago;was on meds x couple of months and has been fine since   Knee pain    Pre-diabetes    Seasonal allergies    takes Allegra as needed as well as using Flonase   Vertigo      Patient Active Problem List   Diagnosis Date Noted   DOE (dyspnea on exertion) 01/14/2017   Headache 12/17/2016   Blood glucose elevated 12/17/2016   Encounter for screening for lipoid disorders 12/17/2016   Spherocytosis ([address]) 12/17/2016   Vitamin D deficiency 12/17/2016   Elevated bilirubin 01/09/2015   Pain in left shoulder 01/09/2015   Symptomatic cholelithiasis 04/14/2011   Anemia 04/14/2011    Past Surgical History:  Procedure Laterality Date   CHOLECYSTECTOMY  05/08/2011   Procedure: LAPAROSCOPIC CHOLECYSTECTOMY WITH INTRAOPERATIVE CHOLANGIOGRAM;  Surgeon: Harl Bowie, MD;  Location: [facility];  Service: General;  Laterality: N/A;  LAPAROSCOPIC CHOLECYSTECTOMY WITH INTRAOPERATIVE CHOLANGIOGRAM   CHOLECYSTECTOMY     COLONOSCOPY     DILATATION & CURETTAGE/HYSTEROSCOPY WITH MYOSURE N/A 02/18/2016   Procedure: DILATATION & CURETTAGE/HYSTEROSCOPY WITH MYOSURE;  Surgeon: Servando Salina, MD;  Location: [facility];  Service: Gynecology;  Laterality: N/A;   ENDOMETRIAL ABLATION  2011   KNEE SURGERY  15+yrs ago   left knee   KNEE SURGERY     TONSILLECTOMY     and adenoids as child   TUBAL LIGATION  1988    OB History  No obstetric history on file.      Home Medications    Prior to Admission medications   Medication Sig Start Date End Date Taking? Authorizing Provider  dextromethorphan-guaiFENesin (MUCINEX DM) 30-600 MG 12hr tablet Take 1 tablet by mouth 2 (two) times daily. 02/19/22  Yes Nyoka Lint, PA-C  Ascorbic Acid (VITAMIN C) 100 MG tablet Take 100 mg by mouth daily.      [provider]  Calcium-Magnesium-Vitamin D (CALCIUM MAGNESIUM PO) Take 1 tablet by mouth 2 (two) times a week.    [provider]  cetirizine (ZYRTEC) 10 MG tablet Take 10 mg by mouth daily.    [provider]  cholecalciferol (VITAMIN D) 1000 UNITS tablet Take 1,000 Units by mouth daily.      [provider]  fluticasone (FLONASE) 50 MCG/ACT nasal spray  Place 2 sprays into the nose daily as needed for rhinitis.     [provider]  FOLIC ACID PO Take 1 tablet by mouth daily.    [provider]  GLUCOSAMINE HCL PO Take 1 tablet by mouth 3 (three) times a week.    [provider]  Multiple Vitamin (MULTIVITAMIN) tablet Take 1 tablet by mouth daily.    [provider]  nitrofurantoin, macrocrystal-monohydrate, (MACROBID) 100 MG capsule Take 1 capsule (100 mg total) by mouth 2 (two) times daily. 05/27/18   Flinchum, Kelby Aline, FNP  Probiotic Product (PROBIOTIC ADVANCED PO) Take 1 capsule by mouth 3 (three) times a week.     [provider]  Turmeric POWD by Does not apply route. 1 teaspoon 3 times weekly    [provider]  Vitamin D, Ergocalciferol, (DRISDOL) 50000 units CAPS capsule Take 1 capsule by mouth once a week. (For 8 weeks) 12/22/16   [provider]    Family History Family History  Problem Relation Age of Onset   CAD Mother    Hereditary spherocytosis Mother    Diabetes Mother    CVA Mother    Diabetes Father    CAD Father    Heart attack Father 83   Cerebral palsy Sister    Breast cancer Paternal Aunt    Hereditary spherocytosis Child    Anesthesia problems Neg Hx    Hypotension Neg Hx    Malignant hyperthermia Neg Hx    Pseudochol deficiency Neg Hx     Social History Social History   Tobacco Use   Smoking status: Never   Smokeless tobacco: Never  Vaping Use   Vaping Use: Never used  Substance Use Topics   Alcohol use: Yes    Comment:  wine once a week   Drug use: No     Allergies   Anesthetics, amide; Celebrex [celecoxib]; and Celecoxib   Review of Systems Review of Systems  Constitutional:  Positive for fever.  HENT:  Positive for rhinorrhea and sinus pressure.   Respiratory:  Positive for cough.      Physical Exam Triage Vital Signs ED Triage Vitals [02/19/22 1426]  Enc Vitals Group     BP 131/70     Pulse Rate 95     Resp 17      Temp 98.8 F (37.1 C)     Temp src      SpO2 99 %     Weight      Height      Head Circumference      Peak Flow      Pain Score  Pain Loc      Pain Edu?      Excl. in [place]?    No data found.  Updated Vital Signs BP 131/70   Pulse 95   Temp 98.8 F (37.1 C)   Resp 17   SpO2 99%   Visual Acuity Right Eye Distance:   Left Eye Distance:   Bilateral Distance:    Right Eye Near:   Left Eye Near:    Bilateral Near:     Physical Exam Constitutional:      Appearance: Normal appearance.  HENT:     Right Ear: Tympanic membrane and ear canal normal.     Left Ear: Tympanic membrane and ear canal normal.     Mouth/Throat:     Mouth: Mucous membranes are moist.     Pharynx: Oropharynx is clear.  Cardiovascular:     Rate and Rhythm: Normal rate and regular rhythm.     Heart sounds: Normal heart sounds.  Pulmonary:     Effort: Pulmonary effort is normal.     Breath sounds: No wheezing, rhonchi or rales.  Lymphadenopathy:     Cervical: No cervical adenopathy.  Neurological:     Mental Status: She is alert.      UC Treatments / Results  Labs (all labs ordered are listed, but only abnormal results are displayed) Labs Reviewed  RESP PANEL BY RT-PCR (FLU A&B, COVID) [id number]    EKG   Radiology No results found.  Procedures Procedures (including critical care time)  Medications Ordered in UC Medications - No data to display  Initial Impression / Assessment and Plan / UC Course  I have reviewed the triage vital signs and the nursing notes.  Pertinent labs & imaging results that were available during my care of the patient were reviewed by me and considered in my medical decision making (see chart for details).    Plan: 1.  The acute cough, upper respiratory infection, lower respiratory infection, will be treated with the following. A.  Mucinex DM every 12 hours for cough and congestion. B.  Tylenol or ibuprofen as needed for fever aches or pains. 2.   COVID/flu test are pending due to presenting symptoms of cough, upper and lower respiratory tract infection, and need to screen for COVID-19. A.  Advised to follow-up PCP or return to urgent care if symptoms fail to improve. Final Clinical Impressions(s) / UC Diagnoses   Final diagnoses:  Acute cough  Acute upper respiratory infection  Encounter for screening for COVID-19  Lower respiratory tract infection     Discharge Instructions      COVID and flu test will be completed in 24 hours or less.  If you do not get a call from this office in 24 hours that indicates the test are negative.  Ingal MyChart to review the results in 24 to 48 hours. Advised take Mucinex DM every 12 hours to help with the cough and chest congestion. Advised take Tylenol or ibuprofen for fever aches and pains. Advised follow-up PCP or return to urgent care if symptoms fail to improve.    ED Prescriptions     Medication Sig Dispense Auth. Provider   dextromethorphan-guaiFENesin (MUCINEX DM) 30-600 MG 12hr tablet Take 1 tablet by mouth 2 (two) times daily. 14 tablet Nyoka Lint, PA-C      PDMP not reviewed this encounter.   Nyoka Lint, PA-C 02/19/22 1445

## 2022-02-19 NOTE — Discharge Instructions (Addendum)
COVID and flu test will be completed in 24 hours or less.  If you do not get a call from this office in 24 hours that indicates the test are negative.  Ingal MyChart to review the results in 24 to 48 hours. Advised take Mucinex DM every 12 hours to help with the cough and chest congestion. Advised take Tylenol or ibuprofen for fever aches and pains. Advised follow-up PCP or return to urgent care if symptoms fail to improve.

## 2022-02-20 ENCOUNTER — Ambulatory Visit: Payer: BC Managed Care – PPO

## 2022-02-20 ENCOUNTER — Telehealth (HOSPITAL_COMMUNITY): Payer: Self-pay | Admitting: Emergency Medicine

## 2022-02-20 MED ORDER — NIRMATRELVIR/RITONAVIR (PAXLOVID)TABLET: 3.0000 | ORAL_TABLET | Freq: Two times a day (BID) | ORAL | 0 refills | Status: AC

## 2022-02-23 ENCOUNTER — Inpatient Hospital Stay: Payer: BC Managed Care – PPO | Admitting: Hematology and Oncology

## 2022-03-27 ENCOUNTER — Inpatient Hospital Stay: Payer: BC Managed Care – PPO | Attending: Hematology and Oncology | Admitting: Oncology

## 2022-03-27 NOTE — Progress Notes (Deleted)
[facility] Initial Visit:  Patient Care Team: Leeroy Cha, MD as PCP - General (Internal Medicine) Willey Blade, MD (Internal Medicine)  CHIEF COMPLAINTS/PURPOSE OF CONSULTATION:  Oncology History   No history exists.    HISTORY OF PRESENTING ILLNESS: Carrie [name] 66 y.o. 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 Hepatomegaly measuring 22.7 cm in maximum craniocaudal dimension, similar prior. Gallbladder surgically absent. Mild prominence of the biliary tree, similar prior and likely reservoir effect post cholecystectomy.  Spleen: Stable splenomegaly measuring 15.2 cm in maximum craniocaudal dimension. January 07, 2022: WBC 4.5 hemoglobin 12.2 MCV 77 RDW 18 platelet 255; 64 segs 29 lymphs 3 eos 1 basophil CMP notable for T. 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   Anemia    Arthritis    back   Boil, breast    Bronchitis 3+yrs ago   Chronic back pain    hx of;pt lost weight and does yoga and back pain gone   Complication of anesthesia    increased Heart Rate after knee surgery   Cyst 1976   left arm    Eczema    Elevated bilirubin    secondary to spherospitic anemia   Fibroids 2009   embolization of fibroids   GERD (gastroesophageal reflux disease)    takes Omeprazole daily prn, history of   Headache    Heart murmur    History of bladder infections 25+yrs ago   used to see a urologist   History of IBS    Hyperlipidemia    Hypothyroidism    hx of 30+yrs ago;was on meds x couple of months and has been fine since   Knee pain    Pre-diabetes    Seasonal allergies    takes Allegra as needed as well as using Flonase   Vertigo     SURGICAL HISTORY: Past Surgical History:  Procedure  Laterality Date   CHOLECYSTECTOMY  05/08/2011   Procedure: LAPAROSCOPIC CHOLECYSTECTOMY WITH INTRAOPERATIVE CHOLANGIOGRAM;  Surgeon: Harl Bowie, MD;  Location: [facility];  Service: General;  Laterality: N/A;  LAPAROSCOPIC CHOLECYSTECTOMY WITH INTRAOPERATIVE CHOLANGIOGRAM   CHOLECYSTECTOMY     COLONOSCOPY     DILATATION & CURETTAGE/HYSTEROSCOPY WITH MYOSURE N/A 02/18/2016   Procedure: [place];  Surgeon: Servando Salina, MD;  Location: [facility];  Service: Gynecology;  Laterality: N/A;   ENDOMETRIAL ABLATION  2011   KNEE SURGERY  15+yrs ago   left knee   KNEE SURGERY     TONSILLECTOMY     and adenoids as child   TUBAL LIGATION  1988    SOCIAL HISTORY: Social History   Socioeconomic History   Marital status: Married    Spouse name: Not on file   Number of children: Not on file   Years of education: Not on file   Highest education level: Not on file  Occupational History   Not on file  Tobacco Use   Smoking status: Never   Smokeless tobacco: Never  Vaping Use   Vaping Use: Never used  Substance and Sexual Activity   Alcohol use: Yes    Comment:  wine once a week   Drug use: No   Sexual activity: Never    Birth  control/protection: Post-menopausal  Other Topics Concern   Not on file  Social History Narrative   ** Merged History Encounter **       Social Determinants of Health   Financial Resource Strain: Not on file  Food Insecurity: Not on file  Transportation Needs: Not on file  Physical Activity: Not on file  Stress: Not on file  Social Connections: Not on file  Intimate Partner Violence: Not on file    FAMILY HISTORY Family History  Problem Relation Age of Onset   CAD Mother    Hereditary spherocytosis Mother    Diabetes Mother    CVA Mother    Diabetes Father    CAD Father    Heart attack Father 20   Cerebral palsy Sister    Breast cancer Paternal Aunt    Hereditary spherocytosis Child    Anesthesia  problems Neg Hx    Hypotension Neg Hx    Malignant hyperthermia Neg Hx    Pseudochol deficiency Neg Hx     ALLERGIES:  is allergic to anesthetics, amide; celebrex [celecoxib]; and celecoxib.  MEDICATIONS:  Current Outpatient Medications  Medication Sig Dispense Refill   Ascorbic Acid (VITAMIN C) 100 MG tablet Take 100 mg by mouth daily.       Calcium-Magnesium-Vitamin D (CALCIUM MAGNESIUM PO) Take 1 tablet by mouth 2 (two) times a week.     cetirizine (ZYRTEC) 10 MG tablet Take 10 mg by mouth daily.     cholecalciferol (VITAMIN D) 1000 UNITS tablet Take 1,000 Units by mouth daily.       dextromethorphan-guaiFENesin (MUCINEX DM) 30-600 MG 12hr tablet Take 1 tablet by mouth 2 (two) times daily. 14 tablet 0   fluticasone (FLONASE) 50 MCG/ACT nasal spray Place 2 sprays into the nose daily as needed for rhinitis.      FOLIC ACID PO Take 1 tablet by mouth daily.     GLUCOSAMINE HCL PO Take 1 tablet by mouth 3 (three) times a week.     Multiple Vitamin (MULTIVITAMIN) tablet Take 1 tablet by mouth daily.     nitrofurantoin, macrocrystal-monohydrate, (MACROBID) 100 MG capsule Take 1 capsule (100 mg total) by mouth 2 (two) times daily. 10 capsule 0   Probiotic Product (PROBIOTIC ADVANCED PO) Take 1 capsule by mouth 3 (three) times a week.      Turmeric POWD by Does not apply route. 1 teaspoon 3 times weekly     Vitamin D, Ergocalciferol, (DRISDOL) 50000 units CAPS capsule Take 1 capsule by mouth once a week. (For 8 weeks)  0   No current facility-administered medications for this visit.    PHYSICAL EXAMINATION:  ECOG PERFORMANCE STATUS: {CHL ONC ECOG PS:[id number]}   There were no vitals filed for this visit.  There were no vitals filed for this visit.   Physical Exam   LABORATORY DATA: I have personally reviewed the data as listed:  No visits with results within 1 Month(s) from this visit.  Latest known visit with results is:  Admission on 02/19/2022, Discharged on 02/19/2022   Component Date Value Ref Range Status   SARS Coronavirus 2 by RT PCR 02/19/2022 POSITIVE (A)  NEGATIVE Final   Comment: (NOTE) SARS-CoV-2 target nucleic acids are DETECTED.  The SARS-CoV-2 RNA is generally detectable in upper respiratory specimens during the acute phase of infection. Positive results are indicative of the presence of the identified virus, but do not rule out bacterial infection or co-infection with other pathogens not detected by the test. Clinical correlation with  patient history and other diagnostic information is necessary to determine patient infection status. The expected result is Negative.  Fact Sheet for Patients: EntrepreneurPulse.com.au  Fact Sheet for Healthcare Providers: IncredibleEmployment.be  This test is not yet approved or cleared by the [place] FDA and  has been authorized for detection and/or diagnosis of SARS-CoV-2 by FDA under an Emergency Use Authorization (EUA).  This EUA will remain in effect (meaning this test can be used) for the duration of  the COVID-19 declaration under Section 564(b)(1) of the A                          ct, 21 U.S.C. section 360bbb-3(b)(1), unless the authorization is terminated or revoked sooner.     Influenza A by PCR 02/19/2022 NEGATIVE  NEGATIVE Final   Influenza B by PCR 02/19/2022 NEGATIVE  NEGATIVE Final   Comment: (NOTE) The Xpert Xpress SARS-CoV-2/FLU/RSV plus assay is intended as an aid in the diagnosis of influenza from Nasopharyngeal swab specimens and should not be used as a sole basis for treatment. Nasal washings and aspirates are unacceptable for Xpert Xpress SARS-CoV-2/FLU/RSV testing.  Fact Sheet for Patients: EntrepreneurPulse.com.au  Fact Sheet for Healthcare Providers: IncredibleEmployment.be  This test is not yet approved or cleared by the [place] FDA and has been authorized for detection and/or  diagnosis of SARS-CoV-2 by FDA under an Emergency Use Authorization (EUA). This EUA will remain in effect (meaning this test can be used) for the duration of the COVID-19 declaration under Section 564(b)(1) of the Act, 21 U.S.C. section 360bbb-3(b)(1), unless the authorization is terminated or revoked.  Performed at [facility], [place] [address], [place], [place] [address]     RADIOGRAPHIC STUDIES: I have personally reviewed the radiological images as listed and agree with the findings in the report  No results found.  ASSESSMENT/PLAN Cancer Staging  No matching staging information was found for the patient.   No problem-specific Assessment & Plan notes found for this encounter.   No orders of the defined types were placed in this encounter.   All questions were answered. The patient knows to call the clinic with any problems, questions or concerns.  This note was electronically signed.    Barbee Cough, MD  03/27/2022 12:23 PM

## 2022-03-30 DIAGNOSIS — M8589 Other specified disorders of bone density and structure, multiple sites: Secondary | ICD-10-CM | POA: Diagnosis not present

## 2022-04-15 DIAGNOSIS — E042 Nontoxic multinodular goiter: Secondary | ICD-10-CM | POA: Diagnosis not present

## 2022-04-15 DIAGNOSIS — R7309 Other abnormal glucose: Secondary | ICD-10-CM | POA: Diagnosis not present

## 2022-04-22 DIAGNOSIS — E042 Nontoxic multinodular goiter: Secondary | ICD-10-CM | POA: Diagnosis not present

## 2022-04-22 DIAGNOSIS — R7309 Other abnormal glucose: Secondary | ICD-10-CM | POA: Diagnosis not present

## 2022-06-15 ENCOUNTER — Telehealth: Payer: Self-pay | Admitting: Hematology and Oncology

## 2022-06-15 NOTE — Telephone Encounter (Signed)
Patient came into scheduling after back and forth phone calls. Rescheduled new heme visit with Dr. Alvy Bimler. Patient is present and notified.

## 2022-07-03 ENCOUNTER — Inpatient Hospital Stay: Payer: BC Managed Care – PPO

## 2022-07-03 ENCOUNTER — Other Ambulatory Visit: Payer: Self-pay

## 2022-07-03 ENCOUNTER — Encounter: Payer: Self-pay | Admitting: Hematology and Oncology

## 2022-07-03 ENCOUNTER — Inpatient Hospital Stay: Payer: BC Managed Care – PPO | Attending: Hematology and Oncology | Admitting: Hematology and Oncology

## 2022-07-03 ENCOUNTER — Telehealth: Payer: Self-pay

## 2022-07-03 VITALS — BP 151/50 | HR 73 | Temp 99.2°F | Resp 18 | Ht 74.0 in | Wt 213.6 lb

## 2022-07-03 DIAGNOSIS — R161 Splenomegaly, not elsewhere classified: Secondary | ICD-10-CM | POA: Diagnosis not present

## 2022-07-03 DIAGNOSIS — R160 Hepatomegaly, not elsewhere classified: Secondary | ICD-10-CM | POA: Insufficient documentation

## 2022-07-03 DIAGNOSIS — D580 Hereditary spherocytosis: Secondary | ICD-10-CM | POA: Diagnosis not present

## 2022-07-03 NOTE — Progress Notes (Signed)
[facility] CONSULT NOTE  Patient Care Team: Leeroy Cha, MD as PCP - General (Internal Medicine) Willey Blade, MD (Internal Medicine)  ASSESSMENT & PLAN:  Spherocytosis [facility]) The patient has diagnosis of hereditary spherocytosis This is the cause of her splenomegaly She does not need further workup She is compliant taking high-dose vitamin 123456 and folic acid supplement She is not symptomatic  We discussed the pathophysiology of hereditary spherocytosis We discussed potential risk of severe hemolysis in the setting of severe infection or stress We also discussed situation where she might have bone marrow suppression from infection or stress causing severe anemia We discussed the risk of splenectomy and at this point in time, she does not need splenectomy We discussed potential risk of iron def anemia given her healthy dietary food choices and I recommend iron studies with her next physician visit to monitor  She does not need long-term follow-up with me   Splenomegaly This is due to hereditary spherocytosis She does not need splenectomy or long-term follow-up  No orders of the defined types were placed in this encounter.   The total time spent in the appointment was 60 minutes encounter with patients including review of chart and various tests results, discussions about plan of care and coordination of care plan   All questions were answered. The patient knows to call the clinic with any problems, questions or concerns. No barriers to learning was detected.  Heath Lark, MD 2/9/20243:34 PM  CHIEF COMPLAINTS/PURPOSE OF CONSULTATION:  Splenomegaly  HISTORY OF PRESENTING ILLNESS:  Carrie [name] 67 y.o. female is here because of splenomegaly The patient has lifelong diagnosis of hereditary spherocytosis.  Her mother, brother and sister are also diagnosed with the same condition She has been taking lifelong folic acid and vitamin B12  supplement She saw hematologist many years ago She had evaluation with ultrasound and CT imaging several years ago and was noted to have splenomegaly She is being referred here for further evaluation She denies signs or symptoms of anemia She eats fairly healthy diet, typically more plant-based and her meat sources are typically from seafood.  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   Anemia    Arthritis    back   Boil, breast    Bronchitis 3+yrs ago   Chronic back pain    hx of;pt lost weight and does yoga and back pain gone   Complication of anesthesia    increased Heart Rate after knee surgery   Cyst 1976   left arm    Eczema    Elevated bilirubin    secondary to spherospitic anemia   Fibroids 2009   embolization of fibroids   GERD (gastroesophageal reflux disease)    takes Omeprazole daily prn, history of   Headache    Heart murmur    History of bladder infections 25+yrs ago   used to see a urologist   History of IBS    Hyperlipidemia    Hypothyroidism    hx of 30+yrs ago;was on meds x couple of months and has been fine since   Knee pain    Pre-diabetes    Seasonal allergies    takes Allegra as needed as well as using Flonase   Vertigo     SURGICAL HISTORY: Past  Surgical History:  Procedure Laterality Date   CHOLECYSTECTOMY  05/08/2011   Procedure: LAPAROSCOPIC CHOLECYSTECTOMY WITH INTRAOPERATIVE CHOLANGIOGRAM;  Surgeon: Harl Bowie, MD;  Location: [facility];  Service: General;  Laterality: N/A;  LAPAROSCOPIC CHOLECYSTECTOMY WITH INTRAOPERATIVE CHOLANGIOGRAM   CHOLECYSTECTOMY     COLONOSCOPY     DILATATION & CURETTAGE/HYSTEROSCOPY WITH MYOSURE N/A 02/18/2016   Procedure: [place];  Surgeon: Servando Salina, MD;  Location:  [facility];  Service: Gynecology;  Laterality: N/A;   ENDOMETRIAL ABLATION  2011   KNEE SURGERY  15+yrs ago   left knee   KNEE SURGERY     TONSILLECTOMY     and adenoids as child   TUBAL LIGATION  1988    SOCIAL HISTORY: Social History   Socioeconomic History   Marital status: Married    Spouse name: Quita Skye   Number of children: 2   Years of education: Not on file   Highest education level: Not on file  Occupational History   Occupation: retired  Tobacco Use   Smoking status: Never   Smokeless tobacco: Never  Vaping Use   Vaping Use: Never used  Substance and Sexual Activity   Alcohol use: Yes    Comment:  wine once a week   Drug use: No   Sexual activity: Never    Birth control/protection: Post-menopausal  Other Topics Concern   Not on file  Social History Narrative   Not on file   Social Determinants of Health   Financial Resource Strain: Not on file  Food Insecurity: Not on file  Transportation Needs: Not on file  Physical Activity: Not on file  Stress: Not on file  Social Connections: Not on file  Intimate Partner Violence: Not on file    FAMILY HISTORY: Family History  Problem Relation Age of Onset   CAD Mother    Hereditary spherocytosis Mother    Diabetes Mother    CVA Mother    Diabetes Father    CAD Father    Heart attack Father 33   Cerebral palsy Sister    Breast cancer Paternal Aunt    Hereditary spherocytosis Child    Anesthesia problems Neg Hx    Hypotension Neg Hx    Malignant hyperthermia Neg Hx    Pseudochol deficiency Neg Hx     ALLERGIES:  is allergic to anesthetics, amide; celebrex [celecoxib]; and celecoxib.  MEDICATIONS:  Current Outpatient Medications  Medication Sig Dispense Refill   cyanocobalamin (VITAMIN B12) 1000 MCG tablet Take 1,000 mcg by mouth daily.     Ascorbic Acid (VITAMIN C) 100 MG tablet Take 100 mg by mouth daily.       Calcium-Magnesium-Vitamin D (CALCIUM MAGNESIUM PO) Take 1 tablet by mouth 2 (two) times a  week.     cetirizine (ZYRTEC) 10 MG tablet Take 10 mg by mouth daily.     cholecalciferol (VITAMIN D) 1000 UNITS tablet Take 1,000 Units by mouth daily.       fluticasone (FLONASE) 50 MCG/ACT nasal spray Place 2 sprays into the nose daily as needed for rhinitis.      FOLIC ACID PO Take 1 tablet by mouth daily.     Probiotic Product (PROBIOTIC ADVANCED PO) Take 1 capsule by mouth 3 (three) times a week.      Turmeric POWD by Does not apply route. 1 teaspoon 3 times weekly     No current facility-administered medications for this visit.    REVIEW OF SYSTEMS:   Constitutional: Denies fevers,  chills or abnormal night sweats Eyes: Denies blurriness of vision, double vision or watery eyes Ears, nose, mouth, throat, and face: Denies mucositis or sore throat Respiratory: Denies cough, dyspnea or wheezes Cardiovascular: Denies palpitation, chest discomfort or lower extremity swelling Gastrointestinal:  Denies nausea, heartburn or change in bowel habits Skin: Denies abnormal skin rashes Lymphatics: Denies new lymphadenopathy or easy bruising Neurological:Denies numbness, tingling or new weaknesses Behavioral/Psych: Mood is stable, no new changes  All other systems were reviewed with the patient and are negative.  PHYSICAL EXAMINATION: ECOG PERFORMANCE STATUS: 0 - Asymptomatic  Vitals:   07/03/22 1253  BP: (!) 151/50  Pulse: 73  Resp: 18  Temp: 99.2 F (37.3 C)  SpO2: 100%   Filed Weights   07/03/22 1253  Weight: 213 lb 9.6 oz (96.9 kg)    GENERAL:alert, no distress and comfortable SKIN: skin color, texture, turgor are normal, no rashes or significant lesions EYES: normal, conjunctiva are pink and non-injected, sclera clear OROPHARYNX:no exudate, no erythema and lips, buccal mucosa, and tongue normal  NECK: supple, thyroid normal size, non-tender, without nodularity LYMPH:  no palpable lymphadenopathy in the cervical, axillary or inguinal LUNGS: clear to auscultation and  percussion with normal breathing effort HEART: regular rate & rhythm and no murmurs and no lower extremity edema ABDOMEN:abdomen soft, non-tender and normal bowel sounds.  Palpable splenomegaly Musculoskeletal:no cyanosis of digits and no clubbing  PSYCH: alert & oriented x 3 with fluent speech NEURO: no focal motor/sensory deficits  LABORATORY DATA:  I have reviewed the data as listed Lab Results  Component Value Date   WBC 3.7 (L) 10/02/2020   HGB 11.6 (L) 10/02/2020   HCT 32.6 (L) 10/02/2020   MCV 78.2 (L) 10/02/2020   PLT 201 10/02/2020   No results for input(s): "NA", "K", "CL", "CO2", "GLUCOSE", "BUN", "CREATININE", "CALCIUM", "GFRNONAA", "GFRAA", "PROT", "ALBUMIN", "AST", "ALT", "ALKPHOS", "BILITOT", "BILIDIR", "IBILI" in the last 8760 hours.  RADIOGRAPHIC STUDIES: I have reviewed her last CT imaging dated 11/13/2020 I have personally reviewed the radiological images as listed and agreed with the findings in the report.

## 2022-07-03 NOTE — Assessment & Plan Note (Signed)
This is due to hereditary spherocytosis She does not need splenectomy or long-term follow-up

## 2022-07-03 NOTE — Telephone Encounter (Signed)
Returned her call. She is asking if she needs to be NPO for labs. Told her no she does not and to please eat. She appreciated the call.

## 2022-07-03 NOTE — Assessment & Plan Note (Addendum)
The patient has diagnosis of hereditary spherocytosis This is the cause of her splenomegaly She does not need further workup She is compliant taking high-dose vitamin 123456 and folic acid supplement She is not symptomatic  We discussed the pathophysiology of hereditary spherocytosis We discussed potential risk of severe hemolysis in the setting of severe infection or stress We also discussed situation where she might have bone marrow suppression from infection or stress causing severe anemia We discussed the risk of splenectomy and at this point in time, she does not need splenectomy We discussed potential risk of iron def anemia given her healthy dietary food choices and I recommend iron studies with her next physician visit to monitor  She does not need long-term follow-up with me

## 2022-07-17 DIAGNOSIS — H6123 Impacted cerumen, bilateral: Secondary | ICD-10-CM | POA: Diagnosis not present

## 2022-07-17 DIAGNOSIS — H903 Sensorineural hearing loss, bilateral: Secondary | ICD-10-CM | POA: Diagnosis not present

## 2022-07-17 DIAGNOSIS — H9201 Otalgia, right ear: Secondary | ICD-10-CM | POA: Diagnosis not present

## 2022-07-23 DIAGNOSIS — Z1231 Encounter for screening mammogram for malignant neoplasm of breast: Secondary | ICD-10-CM | POA: Diagnosis not present

## 2022-08-12 DIAGNOSIS — D580 Hereditary spherocytosis: Secondary | ICD-10-CM | POA: Diagnosis not present

## 2022-08-12 DIAGNOSIS — Z23 Encounter for immunization: Secondary | ICD-10-CM | POA: Diagnosis not present

## 2022-08-12 DIAGNOSIS — Z1331 Encounter for screening for depression: Secondary | ICD-10-CM | POA: Diagnosis not present

## 2022-08-12 DIAGNOSIS — R7309 Other abnormal glucose: Secondary | ICD-10-CM | POA: Diagnosis not present

## 2022-08-12 DIAGNOSIS — Z0000 Encounter for general adult medical examination without abnormal findings: Secondary | ICD-10-CM | POA: Diagnosis not present

## 2022-08-12 DIAGNOSIS — E559 Vitamin D deficiency, unspecified: Secondary | ICD-10-CM | POA: Diagnosis not present

## 2022-08-12 DIAGNOSIS — R161 Splenomegaly, not elsewhere classified: Secondary | ICD-10-CM | POA: Diagnosis not present

## 2022-08-12 DIAGNOSIS — E042 Nontoxic multinodular goiter: Secondary | ICD-10-CM | POA: Diagnosis not present

## 2022-08-24 DIAGNOSIS — E042 Nontoxic multinodular goiter: Secondary | ICD-10-CM | POA: Diagnosis not present

## 2022-08-24 DIAGNOSIS — R7309 Other abnormal glucose: Secondary | ICD-10-CM | POA: Diagnosis not present

## 2022-09-01 DIAGNOSIS — R7309 Other abnormal glucose: Secondary | ICD-10-CM | POA: Diagnosis not present

## 2022-09-01 DIAGNOSIS — D580 Hereditary spherocytosis: Secondary | ICD-10-CM | POA: Diagnosis not present

## 2022-09-01 DIAGNOSIS — E1165 Type 2 diabetes mellitus with hyperglycemia: Secondary | ICD-10-CM | POA: Diagnosis not present

## 2022-09-01 DIAGNOSIS — E042 Nontoxic multinodular goiter: Secondary | ICD-10-CM | POA: Diagnosis not present

## 2022-09-08 DIAGNOSIS — T63461A Toxic effect of venom of wasps, accidental (unintentional), initial encounter: Secondary | ICD-10-CM | POA: Diagnosis not present

## 2022-09-08 DIAGNOSIS — S51801A Unspecified open wound of right forearm, initial encounter: Secondary | ICD-10-CM | POA: Diagnosis not present

## 2022-10-15 DIAGNOSIS — R7309 Other abnormal glucose: Secondary | ICD-10-CM | POA: Diagnosis not present

## 2022-10-15 DIAGNOSIS — D649 Anemia, unspecified: Secondary | ICD-10-CM | POA: Diagnosis not present

## 2022-10-15 DIAGNOSIS — R748 Abnormal levels of other serum enzymes: Secondary | ICD-10-CM | POA: Diagnosis not present

## 2022-10-15 DIAGNOSIS — D580 Hereditary spherocytosis: Secondary | ICD-10-CM | POA: Diagnosis not present

## 2022-10-15 DIAGNOSIS — R82998 Other abnormal findings in urine: Secondary | ICD-10-CM | POA: Diagnosis not present

## 2022-10-15 DIAGNOSIS — R945 Abnormal results of liver function studies: Secondary | ICD-10-CM | POA: Diagnosis not present

## 2022-10-29 DIAGNOSIS — R739 Hyperglycemia, unspecified: Secondary | ICD-10-CM | POA: Diagnosis not present

## 2022-10-29 DIAGNOSIS — R200 Anesthesia of skin: Secondary | ICD-10-CM | POA: Diagnosis not present

## 2022-10-29 DIAGNOSIS — D509 Iron deficiency anemia, unspecified: Secondary | ICD-10-CM | POA: Diagnosis not present

## 2022-10-29 DIAGNOSIS — D580 Hereditary spherocytosis: Secondary | ICD-10-CM | POA: Diagnosis not present

## 2022-11-12 DIAGNOSIS — R1032 Left lower quadrant pain: Secondary | ICD-10-CM | POA: Diagnosis not present

## 2022-11-16 DIAGNOSIS — E559 Vitamin D deficiency, unspecified: Secondary | ICD-10-CM | POA: Diagnosis not present

## 2022-11-16 DIAGNOSIS — E663 Overweight: Secondary | ICD-10-CM | POA: Diagnosis not present

## 2022-11-16 DIAGNOSIS — R632 Polyphagia: Secondary | ICD-10-CM | POA: Diagnosis not present

## 2022-11-16 DIAGNOSIS — E8889 Other specified metabolic disorders: Secondary | ICD-10-CM | POA: Diagnosis not present

## 2022-11-16 DIAGNOSIS — R7303 Prediabetes: Secondary | ICD-10-CM | POA: Diagnosis not present

## 2022-11-16 DIAGNOSIS — Z1331 Encounter for screening for depression: Secondary | ICD-10-CM | POA: Diagnosis not present

## 2022-11-16 DIAGNOSIS — D580 Hereditary spherocytosis: Secondary | ICD-10-CM | POA: Diagnosis not present

## 2022-11-23 DIAGNOSIS — E1165 Type 2 diabetes mellitus with hyperglycemia: Secondary | ICD-10-CM | POA: Diagnosis not present

## 2022-11-30 DIAGNOSIS — E663 Overweight: Secondary | ICD-10-CM | POA: Diagnosis not present

## 2022-11-30 DIAGNOSIS — R7303 Prediabetes: Secondary | ICD-10-CM | POA: Diagnosis not present

## 2022-11-30 DIAGNOSIS — R632 Polyphagia: Secondary | ICD-10-CM | POA: Diagnosis not present

## 2022-11-30 DIAGNOSIS — D580 Hereditary spherocytosis: Secondary | ICD-10-CM | POA: Diagnosis not present

## 2022-11-30 DIAGNOSIS — Z6826 Body mass index (BMI) 26.0-26.9, adult: Secondary | ICD-10-CM | POA: Diagnosis not present

## 2022-11-30 DIAGNOSIS — E559 Vitamin D deficiency, unspecified: Secondary | ICD-10-CM | POA: Diagnosis not present

## 2022-12-02 DIAGNOSIS — E1140 Type 2 diabetes mellitus with diabetic neuropathy, unspecified: Secondary | ICD-10-CM | POA: Diagnosis not present

## 2022-12-02 DIAGNOSIS — E1165 Type 2 diabetes mellitus with hyperglycemia: Secondary | ICD-10-CM | POA: Diagnosis not present

## 2022-12-02 DIAGNOSIS — E042 Nontoxic multinodular goiter: Secondary | ICD-10-CM | POA: Diagnosis not present

## 2022-12-22 DIAGNOSIS — E663 Overweight: Secondary | ICD-10-CM | POA: Diagnosis not present

## 2022-12-22 DIAGNOSIS — E559 Vitamin D deficiency, unspecified: Secondary | ICD-10-CM | POA: Diagnosis not present

## 2022-12-22 DIAGNOSIS — R632 Polyphagia: Secondary | ICD-10-CM | POA: Diagnosis not present

## 2022-12-22 DIAGNOSIS — Z6826 Body mass index (BMI) 26.0-26.9, adult: Secondary | ICD-10-CM | POA: Diagnosis not present

## 2022-12-22 DIAGNOSIS — R7303 Prediabetes: Secondary | ICD-10-CM | POA: Diagnosis not present

## 2022-12-22 DIAGNOSIS — D580 Hereditary spherocytosis: Secondary | ICD-10-CM | POA: Diagnosis not present

## 2023-01-05 DIAGNOSIS — R632 Polyphagia: Secondary | ICD-10-CM | POA: Diagnosis not present

## 2023-01-05 DIAGNOSIS — D580 Hereditary spherocytosis: Secondary | ICD-10-CM | POA: Diagnosis not present

## 2023-01-05 DIAGNOSIS — R7303 Prediabetes: Secondary | ICD-10-CM | POA: Diagnosis not present

## 2023-01-05 DIAGNOSIS — Z6826 Body mass index (BMI) 26.0-26.9, adult: Secondary | ICD-10-CM | POA: Diagnosis not present

## 2023-01-05 DIAGNOSIS — E559 Vitamin D deficiency, unspecified: Secondary | ICD-10-CM | POA: Diagnosis not present

## 2023-01-05 DIAGNOSIS — E663 Overweight: Secondary | ICD-10-CM | POA: Diagnosis not present

## 2023-02-02 ENCOUNTER — Other Ambulatory Visit: Payer: Self-pay | Admitting: Nurse Practitioner

## 2023-02-02 DIAGNOSIS — E663 Overweight: Secondary | ICD-10-CM | POA: Diagnosis not present

## 2023-02-02 DIAGNOSIS — Z6826 Body mass index (BMI) 26.0-26.9, adult: Secondary | ICD-10-CM | POA: Diagnosis not present

## 2023-02-02 DIAGNOSIS — E119 Type 2 diabetes mellitus without complications: Secondary | ICD-10-CM | POA: Diagnosis not present

## 2023-02-02 DIAGNOSIS — R632 Polyphagia: Secondary | ICD-10-CM | POA: Diagnosis not present

## 2023-02-02 DIAGNOSIS — E559 Vitamin D deficiency, unspecified: Secondary | ICD-10-CM | POA: Diagnosis not present

## 2023-02-02 DIAGNOSIS — E042 Nontoxic multinodular goiter: Secondary | ICD-10-CM

## 2023-02-02 DIAGNOSIS — F418 Other specified anxiety disorders: Secondary | ICD-10-CM | POA: Diagnosis not present

## 2023-02-02 DIAGNOSIS — D580 Hereditary spherocytosis: Secondary | ICD-10-CM | POA: Diagnosis not present

## 2023-02-11 DIAGNOSIS — H2513 Age-related nuclear cataract, bilateral: Secondary | ICD-10-CM | POA: Diagnosis not present

## 2023-02-11 DIAGNOSIS — E119 Type 2 diabetes mellitus without complications: Secondary | ICD-10-CM | POA: Diagnosis not present

## 2023-02-11 DIAGNOSIS — H1045 Other chronic allergic conjunctivitis: Secondary | ICD-10-CM | POA: Diagnosis not present

## 2023-02-11 DIAGNOSIS — Q141 Congenital malformation of retina: Secondary | ICD-10-CM | POA: Diagnosis not present

## 2023-02-11 DIAGNOSIS — H04123 Dry eye syndrome of bilateral lacrimal glands: Secondary | ICD-10-CM | POA: Diagnosis not present

## 2023-02-11 IMAGING — CR DG CHEST 2V
2 series · 2 of 2 positions shown · non-contrast
Comparison: 11/01/2017

CLINICAL DATA: Chest pain.  Left shoulder pain

EXAM:
CHEST - 2 VIEW

[w chest pa]
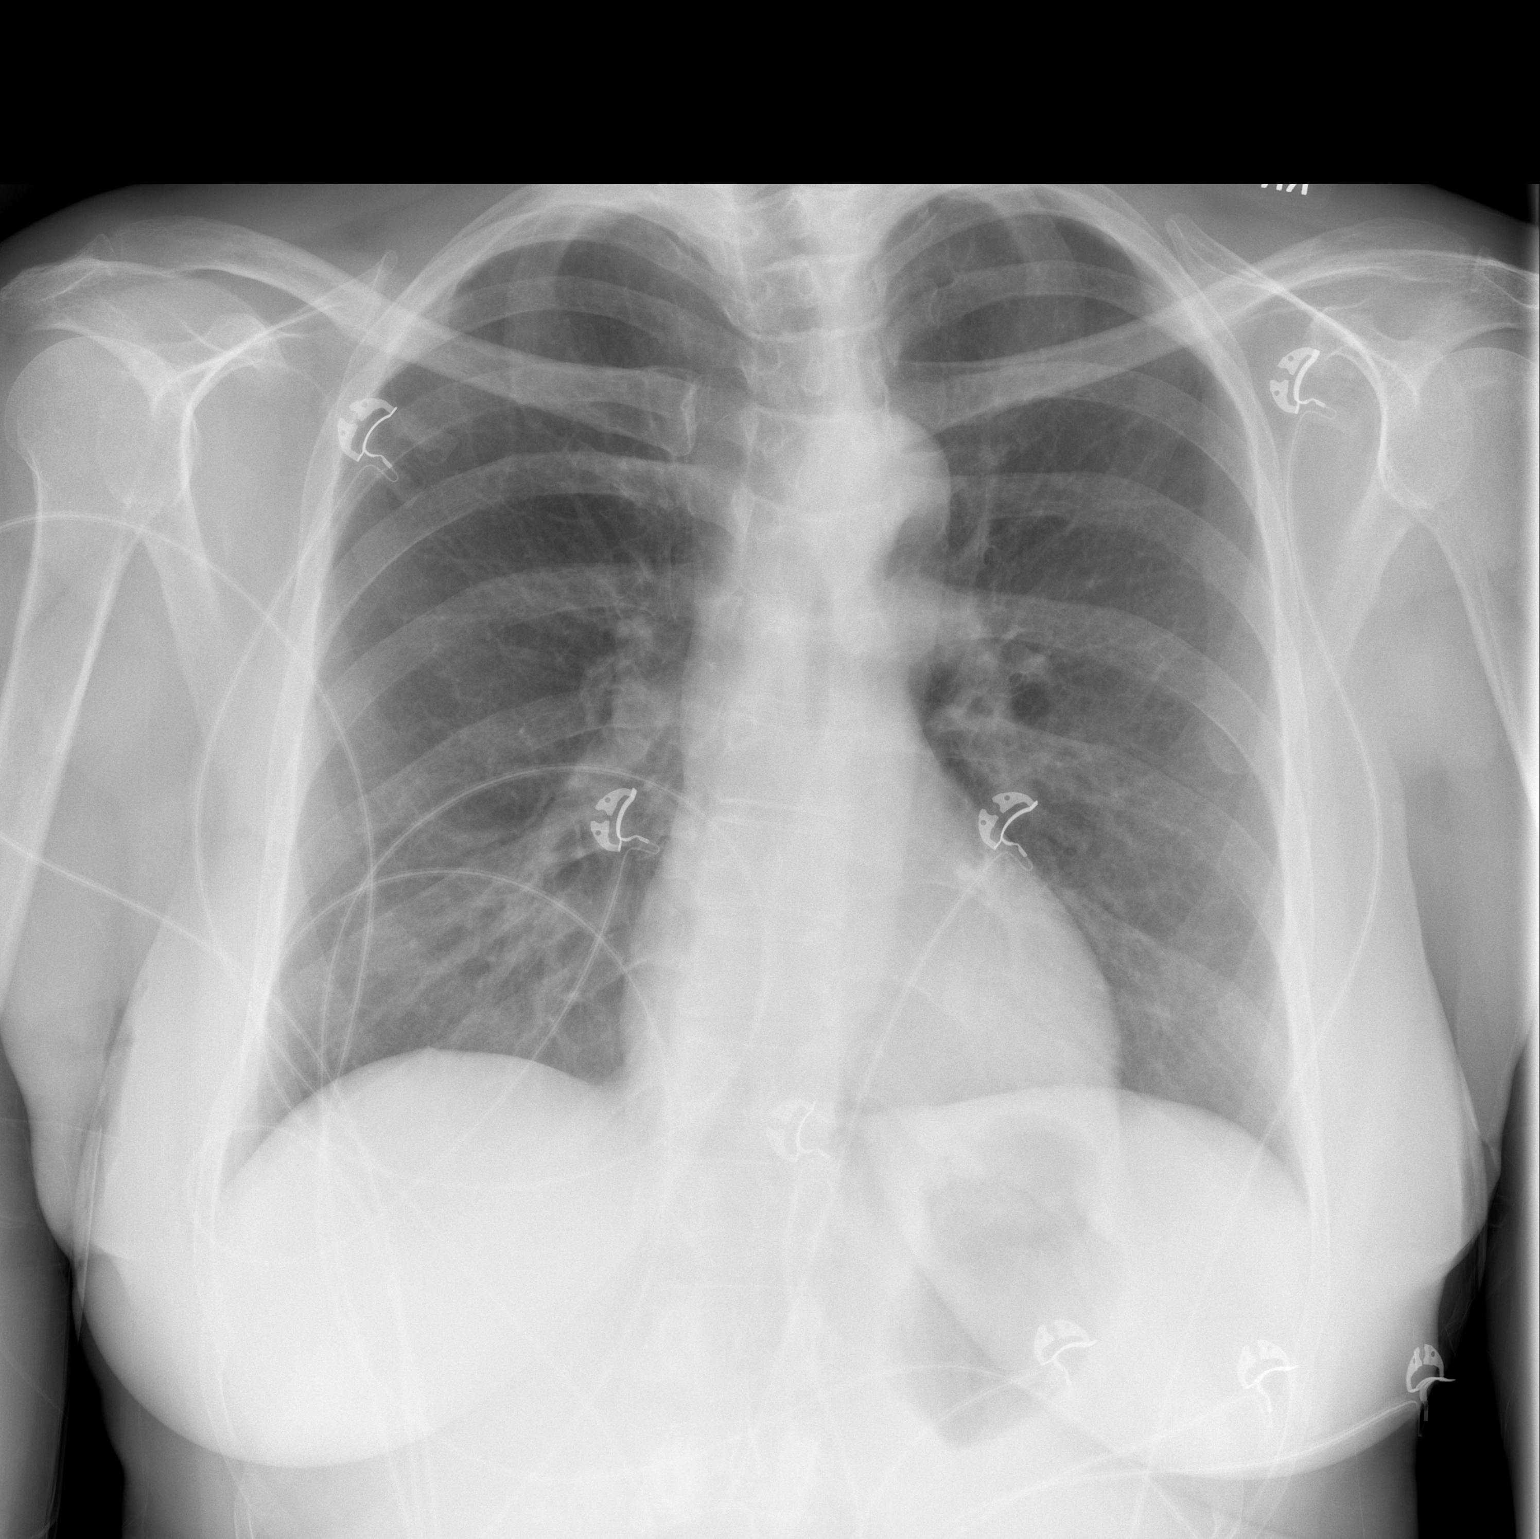

[w chest lat]
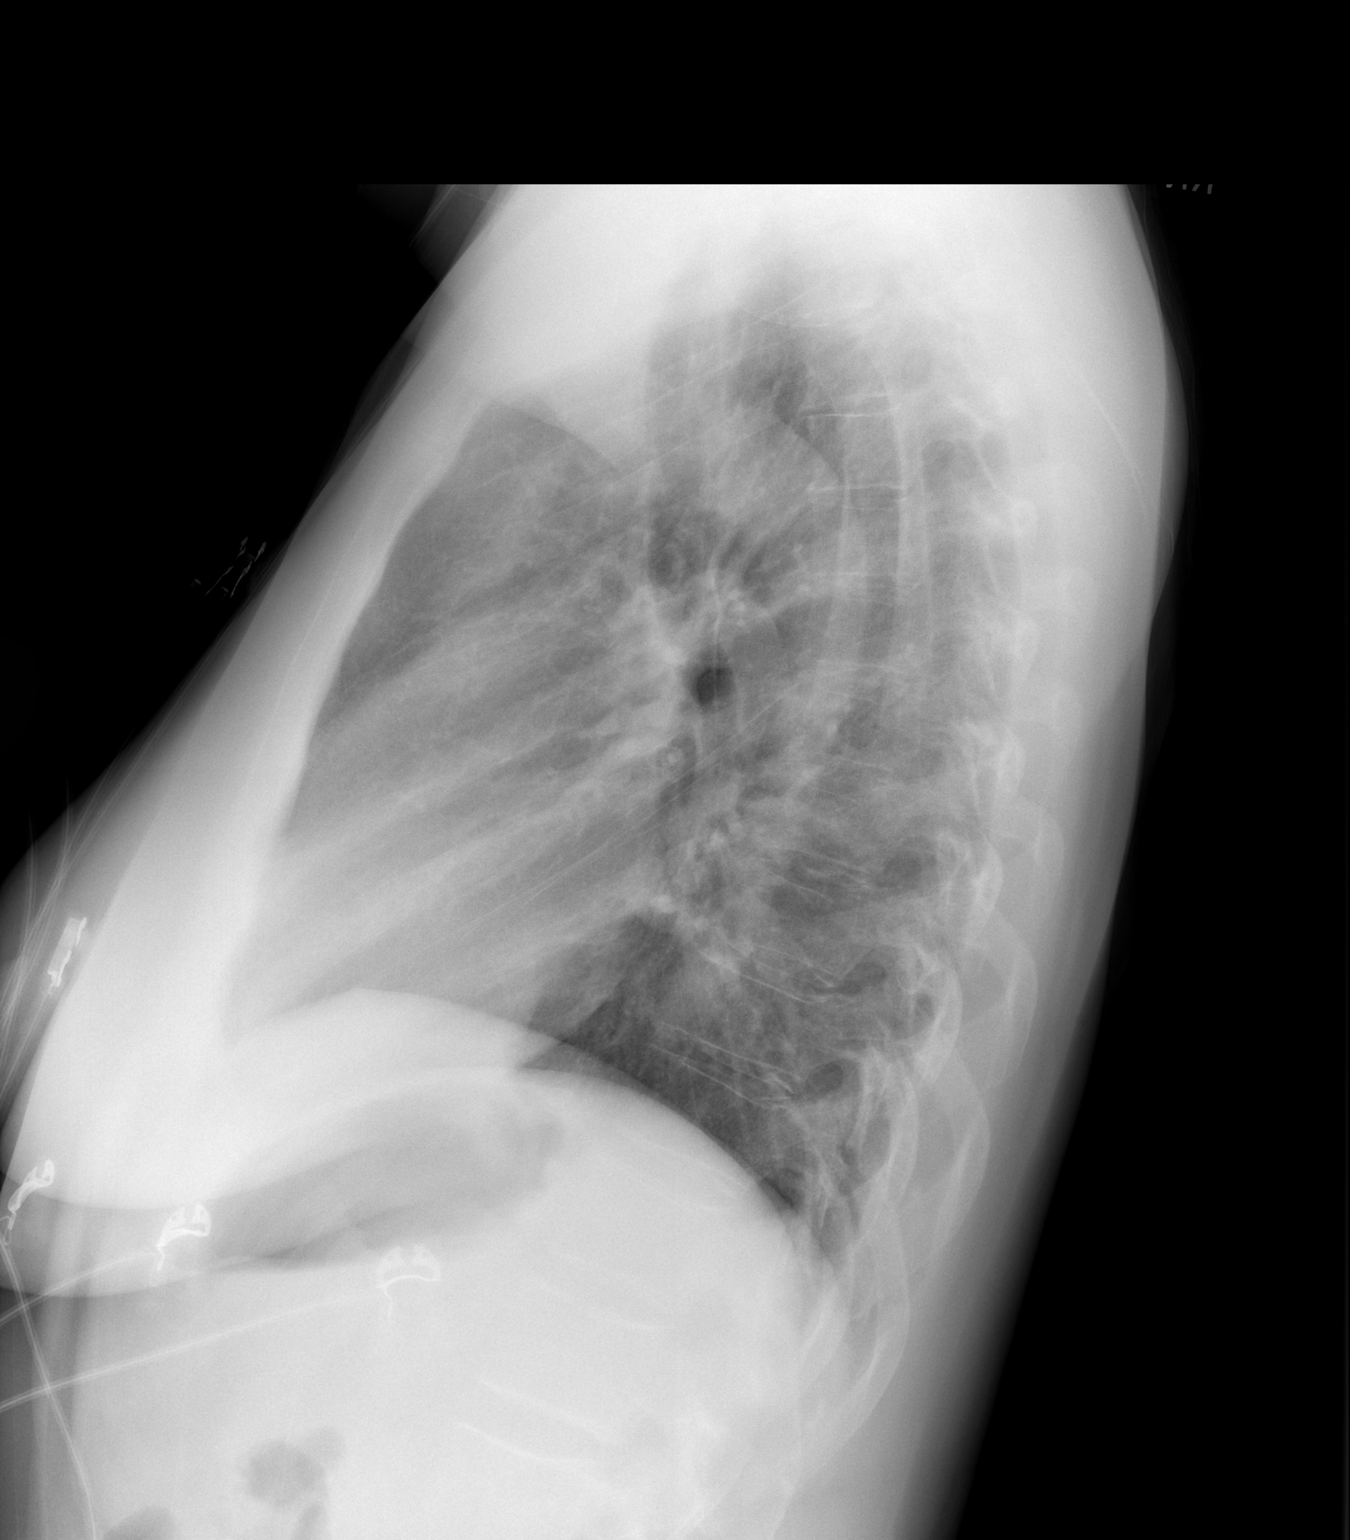

[2 of 2 positions shown; findings below may reference images not displayed]

FINDINGS: Normal heart size and mediastinal contours. No acute infiltrate or
edema. No effusion or pneumothorax. Mild scoliosis. No acute osseous
findings.
IMPRESSION: No acute finding.

## 2023-02-16 DIAGNOSIS — N898 Other specified noninflammatory disorders of vagina: Secondary | ICD-10-CM | POA: Diagnosis not present

## 2023-02-16 DIAGNOSIS — Z124 Encounter for screening for malignant neoplasm of cervix: Secondary | ICD-10-CM | POA: Diagnosis not present

## 2023-02-16 DIAGNOSIS — R87610 Atypical squamous cells of undetermined significance on cytologic smear of cervix (ASC-US): Secondary | ICD-10-CM | POA: Diagnosis not present

## 2023-02-16 DIAGNOSIS — Z9189 Other specified personal risk factors, not elsewhere classified: Secondary | ICD-10-CM | POA: Diagnosis not present

## 2023-02-17 DIAGNOSIS — R200 Anesthesia of skin: Secondary | ICD-10-CM | POA: Diagnosis not present

## 2023-02-17 DIAGNOSIS — D580 Hereditary spherocytosis: Secondary | ICD-10-CM | POA: Diagnosis not present

## 2023-02-17 DIAGNOSIS — R7303 Prediabetes: Secondary | ICD-10-CM | POA: Diagnosis not present

## 2023-02-17 DIAGNOSIS — R079 Chest pain, unspecified: Secondary | ICD-10-CM | POA: Diagnosis not present

## 2023-02-17 DIAGNOSIS — E042 Nontoxic multinodular goiter: Secondary | ICD-10-CM | POA: Diagnosis not present

## 2023-02-17 DIAGNOSIS — J019 Acute sinusitis, unspecified: Secondary | ICD-10-CM | POA: Diagnosis not present

## 2023-02-23 DIAGNOSIS — K219 Gastro-esophageal reflux disease without esophagitis: Secondary | ICD-10-CM | POA: Diagnosis not present

## 2023-02-23 DIAGNOSIS — Z860100 Personal history of colon polyps, unspecified: Secondary | ICD-10-CM | POA: Diagnosis not present

## 2023-02-23 DIAGNOSIS — R079 Chest pain, unspecified: Secondary | ICD-10-CM | POA: Diagnosis not present

## 2023-02-23 DIAGNOSIS — Z1211 Encounter for screening for malignant neoplasm of colon: Secondary | ICD-10-CM | POA: Diagnosis not present

## 2023-02-24 DIAGNOSIS — R7303 Prediabetes: Secondary | ICD-10-CM | POA: Diagnosis not present

## 2023-02-25 ENCOUNTER — Ambulatory Visit
Admission: RE | Admit: 2023-02-25 | Discharge: 2023-02-25 | Disposition: A | Discharge status: Home / Self Care | Payer: Medicare PPO | Source: Ambulatory Visit | Attending: Nurse Practitioner | Admitting: Nurse Practitioner

## 2023-02-25 DIAGNOSIS — E042 Nontoxic multinodular goiter: Secondary | ICD-10-CM

## 2023-02-25 DIAGNOSIS — E041 Nontoxic single thyroid nodule: Secondary | ICD-10-CM | POA: Diagnosis not present

## 2023-03-03 ENCOUNTER — Other Ambulatory Visit: Payer: Self-pay | Admitting: Nurse Practitioner

## 2023-03-03 DIAGNOSIS — E042 Nontoxic multinodular goiter: Secondary | ICD-10-CM

## 2023-03-11 DIAGNOSIS — U071 COVID-19: Secondary | ICD-10-CM | POA: Diagnosis not present

## 2023-05-17 DIAGNOSIS — M542 Cervicalgia: Secondary | ICD-10-CM | POA: Diagnosis not present

## 2023-06-09 IMAGING — US US THYROID
1 series · 13 of 25 positions shown · non-contrast
Comparison: 06/06/2018

CLINICAL DATA: Multinodular goiter

EXAM:
THYROID ULTRASOUND
TECHNIQUE: Ultrasound examination of the thyroid gland and adjacent soft
tissues was performed.

[Series 1: us thyroid · 0.07mm/px · 13 of 42 slices shown]
[im 1/42]
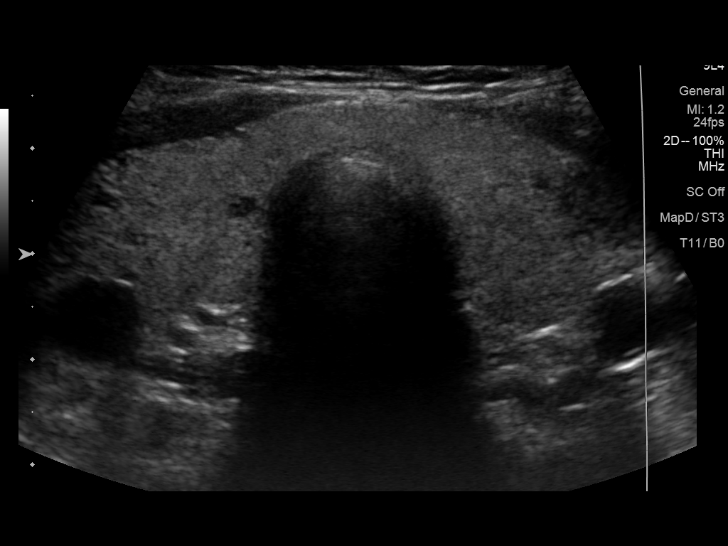
[im 4/42]
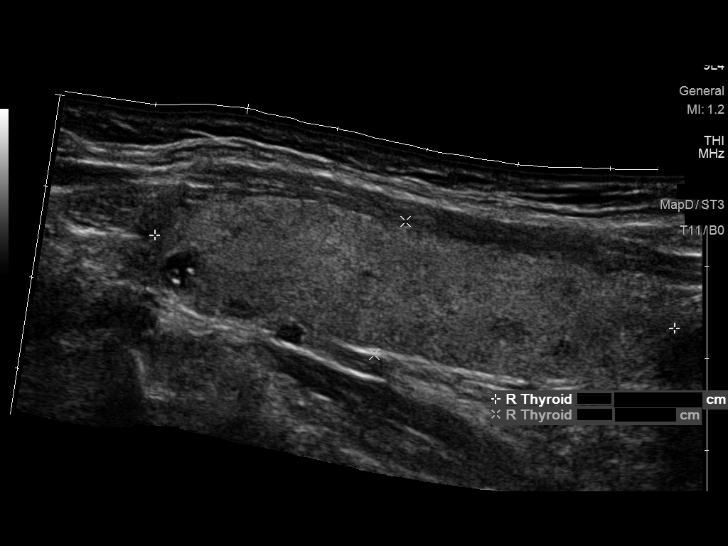
[im 7/42]
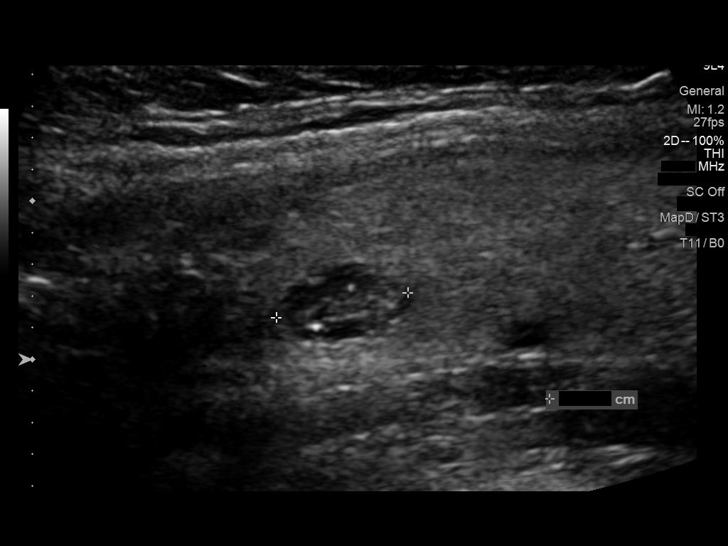
[im 11/42]
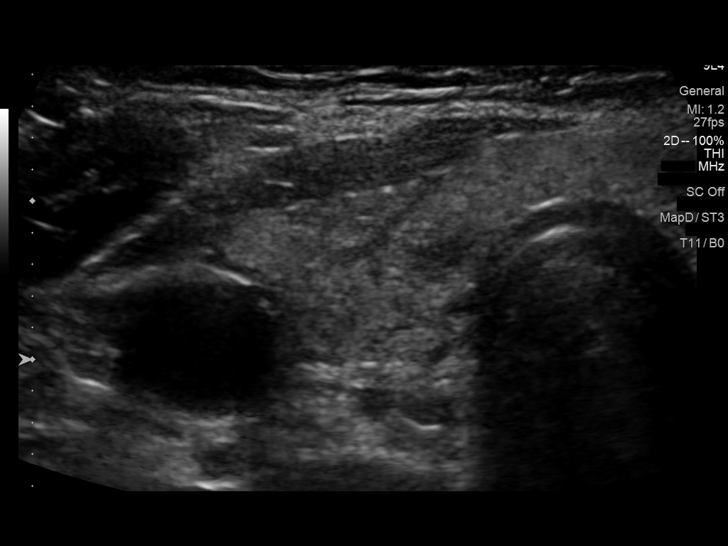
[im 14/42]
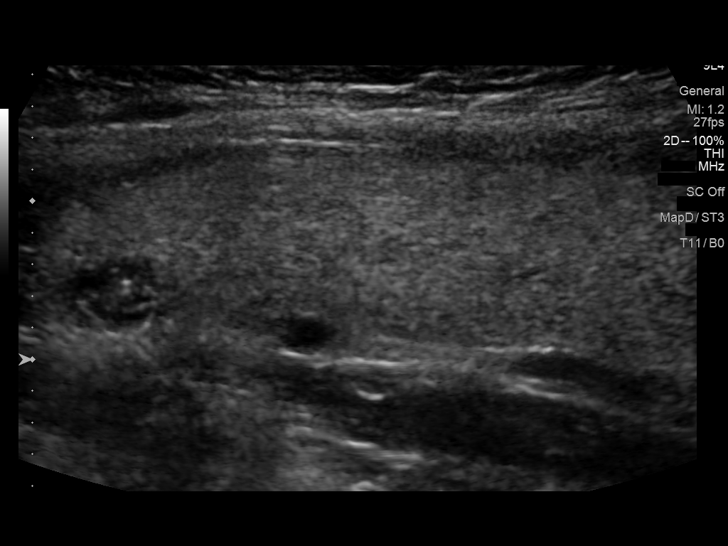
[im 18/42]
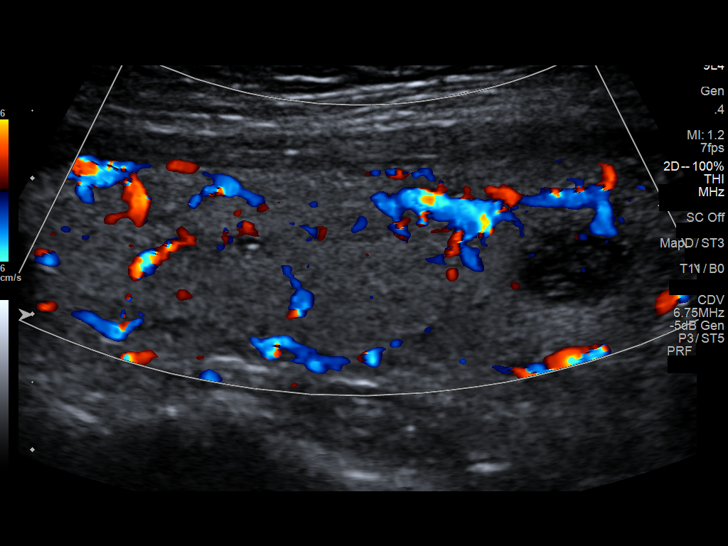
[im 21/42]
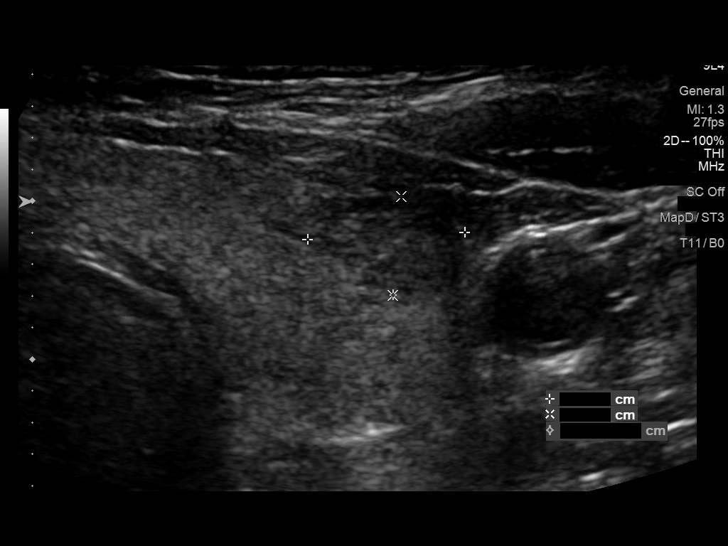
[im 24/42]
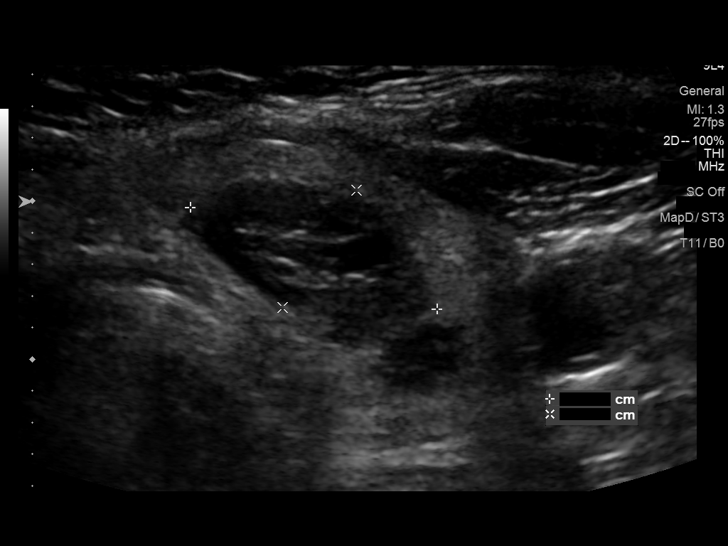
[im 28/42]
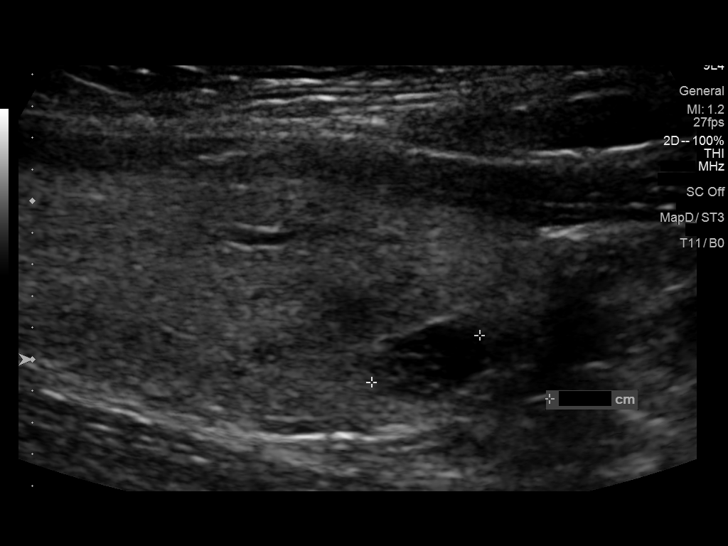
[im 31/42]
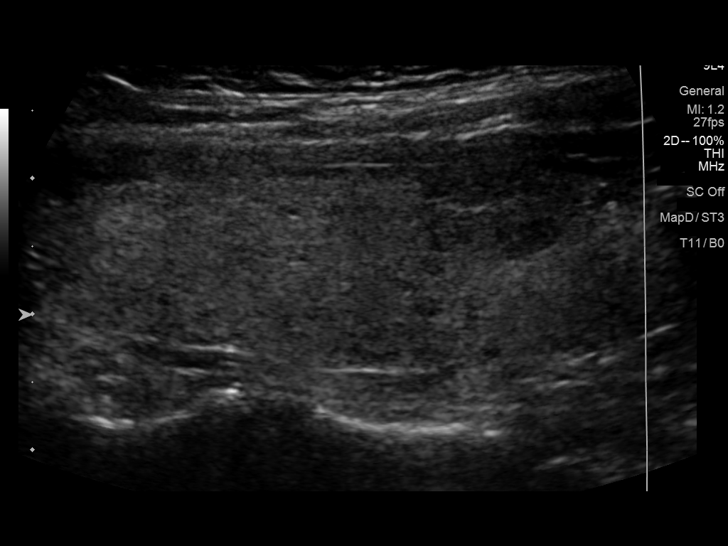
[im 35/42]
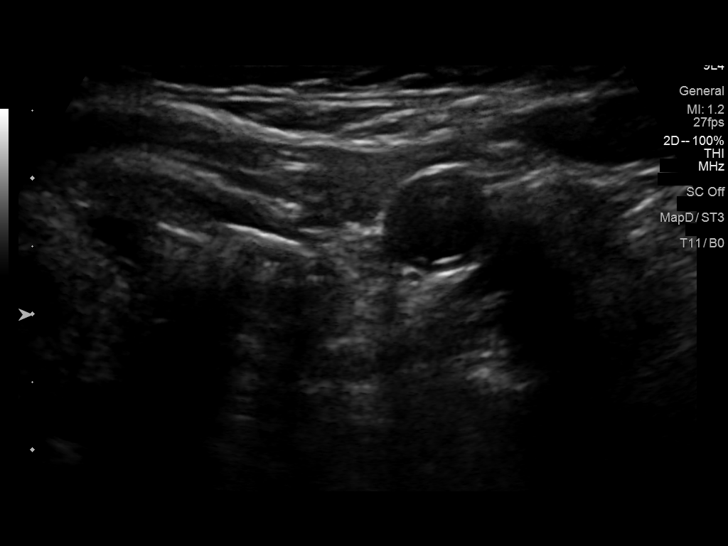
[im 38/42]
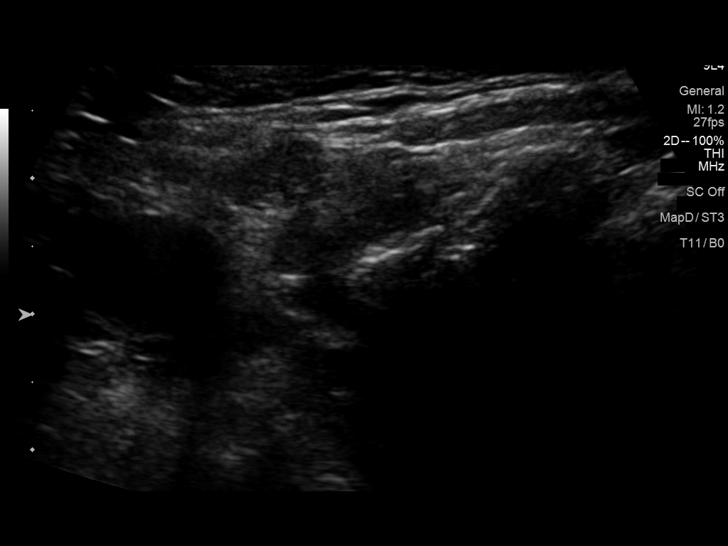
[im 42/42]
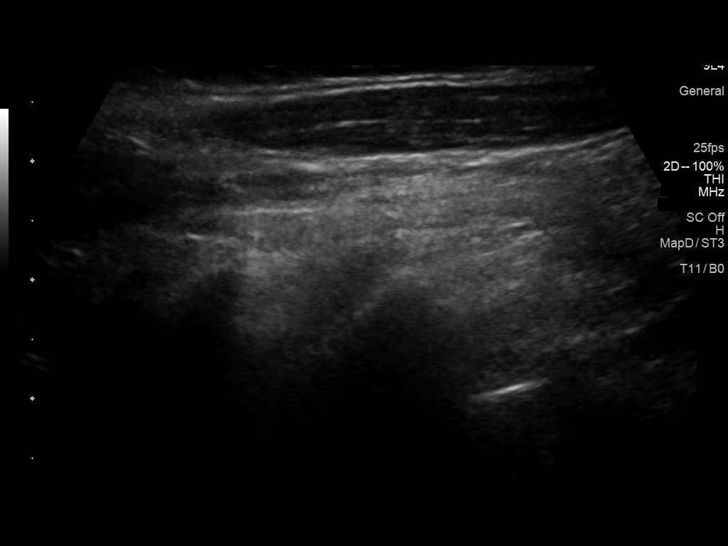

[13 of 25 positions shown; findings below may reference images not displayed]

FINDINGS: Parenchymal Echotexture: Moderately heterogenous

Isthmus: 0.6 cm thickness, previously

Right lobe: 5.7 x 1.5 x 2.3 cm, previously 5.8 x 1.4 x

Left lobe: 5.2 x 2.1 x 1.8 cm, previously 5 x 1.7 x

_________________________________________________________

Estimated total number of nodules >/= 1 cm: 2

Number of spongiform nodules >/=  2 cm not described below (TR1): 0

Number of mixed cystic and solid nodules >/= 1.5 cm not described
below (TR2): 0

_________________________________________________________

Nodule 1: 0.9 cm complex cyst, superior right, previously 0.8; This
nodule does NOT meet TI-RADS criteria for biopsy or dedicated
follow-up.

Nodule # 2:

Prior biopsy: No

Location: Mid; left

Maximum size: 1.3 cm; Other 2 dimensions: 1 x 0.6 cm, previously,
1.4 x 1 x 0.7 cm

Composition: solid/almost completely solid (2)

Echogenicity: hypoechoic (2)

Shape: not taller-than-wide (0)

Margins: ill-defined (0)

Echogenic foci: none (0)

ACR TI-RADS total points: 4.

ACR TI-RADS risk category:  TR4 (4-6 points).

Significant change in size (>/= 20% in two dimensions and minimal
increase of 2 mm): No

Change in features: No

Change in ACR TI-RADS risk category: No

ACR TI-RADS recommendations:

*Given size (>/= 1 - 1.4 cm) and appearance, a follow-up ultrasound
in 1 year should be considered based on TI-RADS criteria.

_________________________________________________________

Nodule # 3:

Prior biopsy: No

Location: Left; inferior

Maximum size: 1.7 cm; Other 2 dimensions: 1.6 x 0.9 cm, previously,
1.4 x 1.2 x 0.8 cm

Composition: mixed cystic and solid (1)

Echogenicity: hypoechoic (2)

Shape: not taller-than-wide (0)

Margins: smooth (0)

Echogenic foci: none (0)

ACR TI-RADS total points: 3.

ACR TI-RADS risk category:  TR3 (3 points).

Significant change in size (>/= 20% in two dimensions and minimal
increase of 2 mm): No

Change in features: No

Change in ACR TI-RADS risk category: No

ACR TI-RADS recommendations:

*Given size (>/= 1.5 - 2.4 cm) and appearance, a follow-up
ultrasound in 1 year should be considered based on TI-RADS criteria.

_________________________________________________________

Nodule 4: 0.7 cm benign colloid cyst, inferior left
IMPRESSION: 1. Thyromegaly with bilateral nodules. None currently meets criteria
for biopsy.
2. Recommend annual/biennial ultrasound follow-up of nodules as
above, until stability x5 years confirmed.

The above is in keeping with the ACR TI-RADS recommendations - [HOSPITAL] 2710;[DATE].

## 2023-06-10 ENCOUNTER — Other Ambulatory Visit (HOSPITAL_COMMUNITY)
Admission: RE | Admit: 2023-06-10 | Discharge: 2023-06-10 | Disposition: A | Discharge status: Home / Self Care | Payer: Medicare Other | Source: Ambulatory Visit | Attending: Nurse Practitioner | Admitting: Nurse Practitioner

## 2023-06-10 ENCOUNTER — Ambulatory Visit
Admission: RE | Admit: 2023-06-10 | Discharge: 2023-06-10 | Disposition: A | Discharge status: Home / Self Care | Payer: Medicare Other | Source: Ambulatory Visit | Attending: Nurse Practitioner | Admitting: Nurse Practitioner

## 2023-06-10 DIAGNOSIS — E042 Nontoxic multinodular goiter: Secondary | ICD-10-CM | POA: Diagnosis not present

## 2023-06-10 DIAGNOSIS — E041 Nontoxic single thyroid nodule: Secondary | ICD-10-CM | POA: Diagnosis not present

## 2023-06-14 LAB — CYTOLOGY - NON PAP

## 2023-06-23 DIAGNOSIS — K635 Polyp of colon: Secondary | ICD-10-CM | POA: Diagnosis not present

## 2023-06-23 DIAGNOSIS — K5730 Diverticulosis of large intestine without perforation or abscess without bleeding: Secondary | ICD-10-CM | POA: Diagnosis not present

## 2023-06-23 DIAGNOSIS — Q438 Other specified congenital malformations of intestine: Secondary | ICD-10-CM | POA: Diagnosis not present

## 2023-06-23 DIAGNOSIS — K648 Other hemorrhoids: Secondary | ICD-10-CM | POA: Diagnosis not present

## 2023-06-23 DIAGNOSIS — Z860101 Personal history of adenomatous and serrated colon polyps: Secondary | ICD-10-CM | POA: Diagnosis not present

## 2023-06-23 DIAGNOSIS — D122 Benign neoplasm of ascending colon: Secondary | ICD-10-CM | POA: Diagnosis not present

## 2023-06-30 DIAGNOSIS — E1165 Type 2 diabetes mellitus with hyperglycemia: Secondary | ICD-10-CM | POA: Diagnosis not present

## 2023-07-07 DIAGNOSIS — E1165 Type 2 diabetes mellitus with hyperglycemia: Secondary | ICD-10-CM | POA: Diagnosis not present

## 2023-07-07 DIAGNOSIS — E042 Nontoxic multinodular goiter: Secondary | ICD-10-CM | POA: Diagnosis not present

## 2023-07-29 DIAGNOSIS — Z1231 Encounter for screening mammogram for malignant neoplasm of breast: Secondary | ICD-10-CM | POA: Diagnosis not present

## 2023-08-01 ENCOUNTER — Emergency Department (HOSPITAL_COMMUNITY)

## 2023-08-01 ENCOUNTER — Encounter (HOSPITAL_COMMUNITY): Payer: Self-pay

## 2023-08-01 ENCOUNTER — Emergency Department (HOSPITAL_COMMUNITY)
Admission: EM | Admit: 2023-08-01 | Discharge: 2023-08-01 | Disposition: A | Discharge status: Home / Self Care | Attending: Emergency Medicine | Admitting: Emergency Medicine

## 2023-08-01 ENCOUNTER — Other Ambulatory Visit: Payer: Self-pay

## 2023-08-01 DIAGNOSIS — R112 Nausea with vomiting, unspecified: Secondary | ICD-10-CM | POA: Diagnosis not present

## 2023-08-01 DIAGNOSIS — R109 Unspecified abdominal pain: Secondary | ICD-10-CM | POA: Diagnosis not present

## 2023-08-01 DIAGNOSIS — Z79899 Other long term (current) drug therapy: Secondary | ICD-10-CM | POA: Diagnosis not present

## 2023-08-01 DIAGNOSIS — R161 Splenomegaly, not elsewhere classified: Secondary | ICD-10-CM | POA: Diagnosis not present

## 2023-08-01 DIAGNOSIS — R0789 Other chest pain: Secondary | ICD-10-CM | POA: Diagnosis not present

## 2023-08-01 DIAGNOSIS — R079 Chest pain, unspecified: Secondary | ICD-10-CM | POA: Diagnosis not present

## 2023-08-01 LAB — COMPREHENSIVE METABOLIC PANEL
ALT: 38 U/L (ref 0–44)
AST: 37 U/L (ref 15–41)
Albumin: 4.7 g/dL (ref 3.5–5.0)
Alkaline Phosphatase: 72 U/L (ref 38–126)
Anion gap: 10 (ref 5–15)
BUN: 22 mg/dL (ref 8–23)
CO2: 21 mmol/L — ABNORMAL LOW (ref 22–32)
Calcium: 9.1 mg/dL (ref 8.9–10.3)
Chloride: 107 mmol/L (ref 98–111)
Creatinine, Ser: 0.62 mg/dL (ref 0.44–1.00)
GFR, Estimated: >60 mL/min (ref >60)
Glucose, Bld: 221 mg/dL — ABNORMAL HIGH (ref 70–99)
Potassium: 3.8 mmol/L (ref 3.5–5.1)
Sodium: 138 mmol/L (ref 135–145)
Total Bilirubin: 3.3 mg/dL — ABNORMAL HIGH (ref 0.0–1.2)
Total Protein: 7.4 g/dL (ref 6.5–8.1)

## 2023-08-01 LAB — CBC WITH DIFFERENTIAL/PLATELET
Abs Immature Granulocytes: 0.05 10*3/uL (ref 0.00–0.07)
Basophils Absolute: 0 10*3/uL (ref 0.0–0.1)
Basophils Relative: 0 %
Eosinophils Absolute: 0.1 10*3/uL (ref 0.0–0.5)
Eosinophils Relative: 0 %
HCT: 38.9 % (ref 36.0–46.0)
Hemoglobin: 13.3 g/dL (ref 12.0–15.0)
Immature Granulocytes: 0 %
Lymphocytes Relative: 2 %
Lymphs Abs: 0.3 10*3/uL — ABNORMAL LOW (ref 0.7–4.0)
MCH: 26.9 pg (ref 26.0–34.0)
MCHC: 34.2 g/dL (ref 30.0–36.0)
MCV: 78.6 fL — ABNORMAL LOW (ref 80.0–100.0)
Monocytes Absolute: 0.3 10*3/uL (ref 0.1–1.0)
Monocytes Relative: 3 %
Neutro Abs: 11.1 10*3/uL — ABNORMAL HIGH (ref 1.7–7.7)
Neutrophils Relative %: 95 %
Platelets: 221 10*3/uL (ref 150–400)
RBC: 4.95 MIL/uL (ref 3.87–5.11)
RDW: 19 % — ABNORMAL HIGH (ref 11.5–15.5)
WBC: 11.8 10*3/uL — ABNORMAL HIGH (ref 4.0–10.5)
nRBC: 0 % (ref 0.0–0.2)

## 2023-08-01 LAB — TROPONIN I (HIGH SENSITIVITY)
Troponin I (High Sensitivity): 2 ng/L (ref <18)
Troponin I (High Sensitivity): 2 ng/L (ref <18)

## 2023-08-01 LAB — TYPE AND SCREEN
ABO/RH(D): O POS
Antibody Screen: NEGATIVE

## 2023-08-01 LAB — LIPASE, BLOOD: Lipase: 26 U/L (ref 11–51)

## 2023-08-01 MED ORDER — ALUM & MAG HYDROXIDE-SIMETH 200-200-20 MG/5ML PO SUSP
30.0000 mL | Freq: Once | ORAL | Status: AC
  Administered 2023-08-01: 30 mL via ORAL
  Filled 2023-08-01: qty 30

## 2023-08-01 MED ORDER — ONDANSETRON HCL 4 MG PO TABS: 4.0000 mg | ORAL_TABLET | Freq: Four times a day (QID) | ORAL | 0 refills | Status: AC

## 2023-08-01 MED ORDER — IOHEXOL 300 MG/ML  SOLN
100.0000 mL | Freq: Once | INTRAMUSCULAR | Status: AC | PRN
  Administered 2023-08-01: 100 mL via INTRAVENOUS

## 2023-08-01 MED ORDER — PANTOPRAZOLE SODIUM 40 MG IV SOLR
80.0000 mg | Freq: Once | INTRAVENOUS | Status: AC
  Administered 2023-08-01: 80 mg via INTRAVENOUS
  Filled 2023-08-01: qty 20

## 2023-08-01 MED ORDER — ONDANSETRON HCL 4 MG/2ML IJ SOLN
4.0000 mg | Freq: Once | INTRAMUSCULAR | Status: AC
  Administered 2023-08-01: 4 mg via INTRAVENOUS
  Filled 2023-08-01: qty 2

## 2023-08-01 MED ORDER — SODIUM CHLORIDE 0.9 % IV BOLUS
1000.0000 mL | Freq: Once | INTRAVENOUS | Status: AC
  Administered 2023-08-01: 1000 mL via INTRAVENOUS

## 2023-08-01 NOTE — ED Triage Notes (Signed)
 Pt presents to ED from home C/O chest pain, nausea, vomiting since 0400.

## 2023-08-01 NOTE — ED Provider Notes (Signed)
 [place] EMERGENCY DEPARTMENT AT [facility] Provider Note   CSN: [id number] Arrival date & time: 08/01/23  0805     History  Chief Complaint  Patient presents with   Chest Pain    Carrie [name] is a 68 y.o. female.  68 year old female with prior medical history as detailed below presents for evaluation.  Patient reports that she was in her normal state of health until early this morning.  She went to bed feeling fine.  Around 4 AM she awoke with sudden nausea and then vomiting.  She reports multiple episodes of vomiting starting at 4 AM.  She does report seeing some small streaks of possible blood in her emesis around 5 AM.  She denies recurrent hematemesis.  She denies prior history of GI pathology.  She denies prior history of GI bleed.  She does not take blood thinners.  She reports that after the vomiting she started having some chest discomfort and had persistent nausea so she came to the ED for evaluation.  The history is provided by the patient and medical records.       Home Medications Prior to Admission medications   Medication Sig Start Date End Date Taking? Authorizing Provider  Ascorbic Acid (VITAMIN C) 100 MG tablet Take 100 mg by mouth daily.      [provider]  Calcium-Magnesium-Vitamin D (CALCIUM MAGNESIUM PO) Take 1 tablet by mouth 2 (two) times a week.    [provider]  cetirizine (ZYRTEC) 10 MG tablet Take 10 mg by mouth daily.    [provider]  cholecalciferol (VITAMIN D) 1000 UNITS tablet Take 1,000 Units by mouth daily.      [provider]  cyanocobalamin (VITAMIN B12) 1000 MCG tablet Take 1,000 mcg by mouth daily.    [provider]  fluticasone (FLONASE) 50 MCG/ACT nasal spray Place 2 sprays into the nose daily as needed for rhinitis.     [provider]  FOLIC ACID PO Take 1 tablet by mouth daily.    [provider]  Probiotic Product (PROBIOTIC ADVANCED PO) Take 1  capsule by mouth 3 (three) times a week.     [provider]  Turmeric POWD by Does not apply route. 1 teaspoon 3 times weekly    [provider]      Allergies    Anesthetics, amide; Celebrex [celecoxib]; and Celecoxib    Review of Systems   Review of Systems  All other systems reviewed and are negative.   Physical Exam Updated Vital Signs BP (!) 163/70   Pulse 92   Temp 97.9 F (36.6 C) (Oral)   Resp (!) 23   Ht 6\' 2"  (1.88 m)   Wt 95.3 kg   SpO2 96%   BMI 26.96 kg/m  Physical Exam Vitals and nursing note reviewed.  Constitutional:      General: She is not in acute distress.    Appearance: Normal appearance. She is well-developed.  HENT:     Head: Normocephalic and atraumatic.  Eyes:     Conjunctiva/sclera: Conjunctivae normal.     Pupils: Pupils are equal, round, and reactive to light.  Cardiovascular:     Rate and Rhythm: Normal rate and regular rhythm.     Heart sounds: Normal heart sounds.  Pulmonary:     Effort: Pulmonary effort is normal. No respiratory distress.     Breath sounds: Normal breath sounds.  Abdominal:     General: There is no distension.  Palpations: Abdomen is soft.     Tenderness: There is no abdominal tenderness.  Musculoskeletal:        General: No deformity. Normal range of motion.     Cervical back: Normal range of motion and neck supple.  Skin:    General: Skin is warm and dry.  Neurological:     General: No focal deficit present.     Mental Status: She is alert and oriented to person, place, and time.     ED Results / Procedures / Treatments   Labs (all labs ordered are listed, but only abnormal results are displayed) Labs Reviewed  CBC WITH DIFFERENTIAL/PLATELET  COMPREHENSIVE METABOLIC PANEL  TROPONIN I (HIGH SENSITIVITY)    EKG EKG Interpretation Date/Time:  Sunday August 01 2023 08:10:43 EDT Ventricular Rate:  93 PR Interval:  146 QRS Duration:  68 QT Interval:  357 QTC Calculation: 444 R  Axis:   67  Text Interpretation: Sinus rhythm Repol abnrm suggests ischemia, diffuse leads Confirmed by Kristine Royal [phone]) on 08/01/2023 8:23:52 AM  Radiology No results found.  Procedures Procedures    Medications Ordered in ED Medications  ondansetron (ZOFRAN) injection 4 mg (has no administration in time range)    ED Course/ Medical Decision Making/ A&P                                 Medical Decision Making Amount and/or Complexity of Data Reviewed Labs: ordered. Radiology: ordered.  Risk OTC drugs. Prescription drug management.    Medical Screen Complete  This patient presented to the ED with complaint of nausea, vomiting, chest discomfort.  This complaint involves an extensive number of treatment options. The initial differential diagnosis includes, but is not limited to, gastroenteritis, ACS, metabolic abnormality, pneumonia, etc.  This presentation is: Acute, Self-Limited, Previously Undiagnosed, Uncertain Prognosis, Complicated, Systemic Symptoms, and Threat to Life/Bodily Function  Patient presents with complaint of acute onset nausea and vomiting.  This is associate with atypical chest discomfort.  She denies diarrhea.  She denies fever.  Patient is much improved after IV fluids and Zofran.  Workup on the whole is without evidence of significant acute pathology.  Notably, patient's troponin x 2 is without significant elevation or delta.  EKG is without suggestion of ischemia.  Patient's lipase is 26.  Patient's electrolytes are without significant abnormality.  CT imaging does not reveal acute pathology.  Patient feels much improved.  She now desires discharge home.  Importance of close follow-up is stressed.  Strict return precautions given and understood.  Co morbidities that complicated the patient's evaluation  See HPI   Additional history obtained: External records from outside sources obtained and reviewed including prior ED visits and prior  Inpatient records.    Lab Tests:  I ordered and personally interpreted labs.  The pertinent results include: CBC, CMP, lipase, troponin x 2   Imaging Studies ordered:  I ordered imaging studies including chest x-ray, CT abdomen pelvis I independently visualized and interpreted obtained imaging which showed NAD I agree with the radiologist interpretation.   Cardiac Monitoring:  The patient was maintained on a cardiac monitor.  I personally viewed and interpreted the cardiac monitor which showed an underlying rhythm of: NSR   Medicines ordered:  I ordered medication including Zofran, IV fluids, Protonix for nausea, vomiting Reevaluation of the patient after these medicines showed that the patient: improved   Problem List / ED Course:  Nausea, vomiting  Reevaluation:  After the interventions noted above, I reevaluated the patient and found that they have: resolved   Disposition:  After consideration of the diagnostic results and the patients response to treatment, I feel that the patent would benefit from close outpatient followup.          Final Clinical Impression(s) / ED Diagnoses Final diagnoses:  Nausea and vomiting, unspecified vomiting type    Rx / DC Orders ED Discharge Orders          Ordered    ondansetron (ZOFRAN) 4 MG tablet  Every 6 hours        08/01/23 1308              Wynetta Fines, MD 08/01/23 1319

## 2023-08-01 NOTE — Discharge Instructions (Signed)
 Return for any problem.  ?

## 2023-08-10 DIAGNOSIS — D126 Benign neoplasm of colon, unspecified: Secondary | ICD-10-CM | POA: Diagnosis not present

## 2023-08-10 DIAGNOSIS — R112 Nausea with vomiting, unspecified: Secondary | ICD-10-CM | POA: Diagnosis not present

## 2023-08-10 DIAGNOSIS — R5383 Other fatigue: Secondary | ICD-10-CM | POA: Diagnosis not present

## 2023-08-10 DIAGNOSIS — K920 Hematemesis: Secondary | ICD-10-CM | POA: Diagnosis not present

## 2023-08-10 DIAGNOSIS — K529 Noninfective gastroenteritis and colitis, unspecified: Secondary | ICD-10-CM | POA: Diagnosis not present

## 2023-08-18 DIAGNOSIS — R7303 Prediabetes: Secondary | ICD-10-CM | POA: Diagnosis not present

## 2023-08-18 DIAGNOSIS — M85859 Other specified disorders of bone density and structure, unspecified thigh: Secondary | ICD-10-CM | POA: Diagnosis not present

## 2023-08-18 DIAGNOSIS — Z23 Encounter for immunization: Secondary | ICD-10-CM | POA: Diagnosis not present

## 2023-08-18 DIAGNOSIS — E042 Nontoxic multinodular goiter: Secondary | ICD-10-CM | POA: Diagnosis not present

## 2023-08-18 DIAGNOSIS — Z136 Encounter for screening for cardiovascular disorders: Secondary | ICD-10-CM | POA: Diagnosis not present

## 2023-08-18 DIAGNOSIS — E559 Vitamin D deficiency, unspecified: Secondary | ICD-10-CM | POA: Diagnosis not present

## 2023-08-18 DIAGNOSIS — R160 Hepatomegaly, not elsewhere classified: Secondary | ICD-10-CM | POA: Diagnosis not present

## 2023-08-18 DIAGNOSIS — R161 Splenomegaly, not elsewhere classified: Secondary | ICD-10-CM | POA: Diagnosis not present

## 2023-08-18 DIAGNOSIS — Z1211 Encounter for screening for malignant neoplasm of colon: Secondary | ICD-10-CM | POA: Diagnosis not present

## 2023-08-18 DIAGNOSIS — Z0000 Encounter for general adult medical examination without abnormal findings: Secondary | ICD-10-CM | POA: Diagnosis not present

## 2024-01-04 DIAGNOSIS — E1165 Type 2 diabetes mellitus with hyperglycemia: Secondary | ICD-10-CM | POA: Diagnosis not present

## 2024-01-04 DIAGNOSIS — E042 Nontoxic multinodular goiter: Secondary | ICD-10-CM | POA: Diagnosis not present

## 2024-01-11 DIAGNOSIS — E1165 Type 2 diabetes mellitus with hyperglycemia: Secondary | ICD-10-CM | POA: Diagnosis not present

## 2024-01-11 DIAGNOSIS — E042 Nontoxic multinodular goiter: Secondary | ICD-10-CM | POA: Diagnosis not present

## 2024-02-08 DIAGNOSIS — R202 Paresthesia of skin: Secondary | ICD-10-CM | POA: Insufficient documentation

## 2024-02-08 DIAGNOSIS — E042 Nontoxic multinodular goiter: Secondary | ICD-10-CM | POA: Insufficient documentation

## 2024-02-08 DIAGNOSIS — F419 Anxiety disorder, unspecified: Secondary | ICD-10-CM | POA: Insufficient documentation

## 2024-02-08 DIAGNOSIS — M5430 Sciatica, unspecified side: Secondary | ICD-10-CM | POA: Insufficient documentation

## 2024-02-08 DIAGNOSIS — E783 Hyperchylomicronemia: Secondary | ICD-10-CM | POA: Insufficient documentation

## 2024-02-08 DIAGNOSIS — E049 Nontoxic goiter, unspecified: Secondary | ICD-10-CM | POA: Insufficient documentation

## 2024-02-08 DIAGNOSIS — E8809 Other disorders of plasma-protein metabolism, not elsewhere classified: Secondary | ICD-10-CM | POA: Insufficient documentation

## 2024-02-08 DIAGNOSIS — D580 Hereditary spherocytosis: Secondary | ICD-10-CM | POA: Insufficient documentation

## 2024-02-08 DIAGNOSIS — G629 Polyneuropathy, unspecified: Secondary | ICD-10-CM | POA: Insufficient documentation

## 2024-02-08 DIAGNOSIS — D72819 Decreased white blood cell count, unspecified: Secondary | ICD-10-CM | POA: Insufficient documentation

## 2024-02-08 DIAGNOSIS — R300 Dysuria: Secondary | ICD-10-CM | POA: Insufficient documentation

## 2024-02-08 DIAGNOSIS — M67442 Ganglion, left hand: Secondary | ICD-10-CM | POA: Insufficient documentation

## 2024-02-14 ENCOUNTER — Encounter: Payer: Self-pay | Admitting: Neurology

## 2024-02-14 ENCOUNTER — Ambulatory Visit: Admitting: Neurology

## 2024-02-14 VITALS — BP 122/76 | Ht 74.0 in | Wt 206.0 lb

## 2024-02-14 DIAGNOSIS — R202 Paresthesia of skin: Secondary | ICD-10-CM

## 2024-02-14 NOTE — Progress Notes (Signed)
 Chief Complaint  Patient presents with   New Patient (Initial Visit)    Rm 15, NP for diabetic neuropathy, alone   ASSESSMENT AND PLAN  Carrie [name] is a 68 y.o. female   Intermittent bilateral feet paresthesia  Fairly normal neurological examination, worrisome for small fiber neuropathy, she does have history of diabetes, laboratory evaluation to rule out other treatable etiology  Continue moderate exercise  DIAGNOSTIC DATA (LABS, IMAGING, TESTING) - I reviewed patient records, labs, notes, testing and imaging myself where available.   MEDICAL HISTORY:  Carrie [name] is a 68 year old female, seen in request by [facility] nurse practitioner Roy Raisin for evaluation of bilateral feet paresthesia, initial evaluation February 14, 2024  History is obtained from the patient and review of electronic medical records. I personally reviewed pertinent available imaging films in PACS.   PMHx of  DM,    She had intermittent cramping in her feet since age 6s, involving both left and right, over past 6 months, she also noticed numbness tingling pinprick sensation at the tip her toes, she denies gait abnormality, no low back pain, no bowel and bladder incontinence, no fingertip paresthesia  She remains active, practice tai chi, yoga regularly without any difficulty, is taking multiple supplement  Has diabetes, CMP March 2025, glucose 221, CBC showed hemoglobin of 13.3  PHYSICAL EXAM:   Vitals:   02/14/24 1327  BP: 122/76  Weight: 206 lb (93.4 kg)  Height: 6' 2 (1.88 m)   Not recorded     Body mass index is 26.45 kg/m.  PHYSICAL EXAMNIATION:  Gen: NAD, conversant, well nourised, well groomed                     Cardiovascular: Regular rate rhythm, no peripheral edema, warm, nontender. Eyes: Conjunctivae clear without exudates or hemorrhage Neck: Supple, no carotid bruits. Pulmonary: Clear to auscultation bilaterally   NEUROLOGICAL EXAM:  MENTAL  STATUS: Speech/cognition: Awake, alert, oriented to history taking and casual conversation CRANIAL NERVES: CN II: Visual fields are full to confrontation. Pupils are round equal and briskly reactive to light. CN III, IV, VI: extraocular movement are normal. No ptosis. CN V: Facial sensation is intact to light touch CN VII: Face is symmetric with normal eye closure  CN VIII: Hearing is normal to causal conversation. CN IX, X: Phonation is normal. CN XI: Head turning and shoulder shrug are intact  MOTOR: There is no pronator drift of out-stretched arms. Muscle bulk and tone are normal. Muscle strength is normal.  REFLEXES: Reflexes are 2+ and symmetric at the biceps, triceps, knees, and ankles. Plantar responses are flexor.  SENSORY: Intact to light touch, pinprick and vibratory sensation are intact in fingers and toes.  COORDINATION: There is no trunk or limb dysmetria noted.  GAIT/STANCE: Posture is normal. Gait is steady with normal steps, base, arm swing, and turning. Heel and toe walking are normal. Tandem gait is normal.  Romberg is absent.  REVIEW OF SYSTEMS:  Full 14 system review of systems performed and notable only for as above All other review of systems were negative.   ALLERGIES: Allergies  Allergen Reactions   Anesthetics, Amide     INCREASED HEART RATE   Celebrex [Celecoxib]    Celecoxib     SEVERE CHEST PAIN    HOME MEDICATIONS: Current Outpatient Medications  Medication Sig Dispense Refill   Ascorbic Acid (VITAMIN C) 100 MG tablet Take 100 mg by mouth daily.  Calcium-Magnesium-Vitamin D  (CALCIUM MAGNESIUM PO) Take 1 tablet by mouth 2 (two) times a week.     Caraway Oil-Levomenthol (FDGARD) 25-20.75 MG CAPS Take 1 tablet by mouth daily.     cetirizine (ZYRTEC) 10 MG tablet Take 10 mg by mouth daily.     cholecalciferol (VITAMIN D ) 1000 UNITS tablet Take 1,000 Units by mouth daily.       cyanocobalamin  (VITAMIN B12) 1000 MCG tablet Take 1,000 mcg  by mouth daily.     empagliflozin (JARDIANCE) 10 MG TABS tablet Take 5 mg by mouth daily.     fluticasone (FLONASE) 50 MCG/ACT nasal spray Place 2 sprays into the nose daily as needed for rhinitis.      FOLIC ACID  PO Take 1 tablet by mouth daily.     Peppermint Oil (IBGARD) 90 MG CPCR Take 1 tablet by mouth daily.     Probiotic Product (PROBIOTIC ADVANCED PO) Take 1 capsule by mouth 3 (three) times a week.      Turmeric POWD by Does not apply route. 1 teaspoon 3 times weekly     ondansetron  (ZOFRAN ) 4 MG tablet Take 1 tablet (4 mg total) by mouth every 6 (six) hours. 12 tablet 0   No current facility-administered medications for this visit.    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   Fibroids 2009   embolization of fibroids   GERD (gastroesophageal reflux disease)    takes Omeprazole daily prn, history of   Headache    Heart murmur    History of bladder infections 25+yrs ago   used to see a urologist   History of IBS    Hyperlipidemia    Hypothyroidism    hx of 30+yrs ago;was on meds x couple of months and has been fine since   Knee pain    Pre-diabetes    Seasonal allergies    takes Allegra as needed as well as using Flonase   Vertigo     PAST SURGICAL HISTORY: Past Surgical History:  Procedure Laterality Date   CHOLECYSTECTOMY  05/08/2011   Procedure: LAPAROSCOPIC CHOLECYSTECTOMY WITH INTRAOPERATIVE CHOLANGIOGRAM;  Surgeon: Vicenta DELENA Poli, MD;  Location: [facility] OR;  Service: General;  Laterality: N/A;  LAPAROSCOPIC CHOLECYSTECTOMY WITH INTRAOPERATIVE  CHOLANGIOGRAM   CHOLECYSTECTOMY     COLONOSCOPY     DILATATION & CURETTAGE/HYSTEROSCOPY WITH MYOSURE N/A 02/18/2016   Procedure: DILATATION & CURETTAGE/HYSTEROSCOPY WITH MYOSURE;  Surgeon: Dickie Carder, MD;  Location: [facility];  Service: Gynecology;  Laterality: N/A;   ENDOMETRIAL ABLATION  2011   KNEE SURGERY  15+yrs ago   left knee   KNEE SURGERY     TONSILLECTOMY     and adenoids as child   TUBAL LIGATION  1988    FAMILY HISTORY: Family History  Problem Relation Age of Onset   CAD Mother    Hereditary spherocytosis Mother    Diabetes Mother    CVA Mother  Diabetes Father    CAD Father    Heart attack Father 12   Cerebral palsy Sister    Breast cancer Paternal Aunt    Hereditary spherocytosis Child    Anesthesia problems Neg Hx    Hypotension Neg Hx    Malignant hyperthermia Neg Hx    Pseudochol deficiency Neg Hx     SOCIAL HISTORY: Social History   Socioeconomic History   Marital status: Married    Spouse name: Cheryl   Number of children: 2   Years of education: Not on file   Highest education level: Not on file  Occupational History   Occupation: retired  Tobacco Use   Smoking status: Never   Smokeless tobacco: Never  Vaping Use   Vaping status: Never Used  Substance and Sexual Activity   Alcohol use: Yes    Comment:  wine once a week   Drug use: No   Sexual activity: Never    Birth control/protection: Post-menopausal  Other Topics Concern   Not on file  Social History Narrative   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 Ph.D.  [facility] [address], [address] [place], [place] [address] Ph: [phone] Fax: [phone]  CC:  Roy Harlene HERO, NP [address] [place],  [place] [address]  Elliot Charm, MD

## 2024-02-17 DIAGNOSIS — E119 Type 2 diabetes mellitus without complications: Secondary | ICD-10-CM | POA: Diagnosis not present

## 2024-02-17 DIAGNOSIS — Q141 Congenital malformation of retina: Secondary | ICD-10-CM | POA: Diagnosis not present

## 2024-02-17 DIAGNOSIS — H1045 Other chronic allergic conjunctivitis: Secondary | ICD-10-CM | POA: Diagnosis not present

## 2024-02-17 DIAGNOSIS — H2513 Age-related nuclear cataract, bilateral: Secondary | ICD-10-CM | POA: Diagnosis not present

## 2024-02-17 DIAGNOSIS — H04123 Dry eye syndrome of bilateral lacrimal glands: Secondary | ICD-10-CM | POA: Diagnosis not present

## 2024-02-22 ENCOUNTER — Other Ambulatory Visit: Payer: Self-pay

## 2024-02-22 ENCOUNTER — Other Ambulatory Visit (INDEPENDENT_AMBULATORY_CARE_PROVIDER_SITE_OTHER): Payer: Self-pay

## 2024-02-22 DIAGNOSIS — R202 Paresthesia of skin: Secondary | ICD-10-CM

## 2024-02-22 DIAGNOSIS — Z0289 Encounter for other administrative examinations: Secondary | ICD-10-CM

## 2024-02-24 ENCOUNTER — Ambulatory Visit: Payer: Self-pay | Admitting: Neurology

## 2024-02-24 LAB — MULTIPLE MYELOMA PANEL, SERUM
Albumin SerPl Elph-Mcnc: 4.4 g/dL (ref 2.9–4.4)
Albumin/Glob SerPl: 1.8 — AB (ref 0.7–1.7)
Alpha 1: 0.2 g/dL (ref 0.0–0.4)
Alpha2 Glob SerPl Elph-Mcnc: 0.5 g/dL (ref 0.4–1.0)
B-Globulin SerPl Elph-Mcnc: 0.9 g/dL (ref 0.7–1.3)
Gamma Glob SerPl Elph-Mcnc: 1 g/dL (ref 0.4–1.8)
Globulin, Total: 2.5 g/dL (ref 2.2–3.9)
IgA/Immunoglobulin A, Serum: 72 mg/dL — AB (ref 87–352)
IgG (Immunoglobin G), Serum: 918 mg/dL (ref 586–1602)
IgM (Immunoglobulin M), Srm: 47 mg/dL (ref 26–217)
M Protein SerPl Elph-Mcnc: 0.1 g/dL — AB

## 2024-02-24 LAB — COMPREHENSIVE METABOLIC PANEL WITH GFR
ALT: 18 IU/L (ref 0–32)
AST: 18 IU/L (ref 0–40)
Albumin: 5.1 g/dL — ABNORMAL HIGH (ref 3.9–4.9)
Alkaline Phosphatase: 81 IU/L (ref 49–135)
BUN/Creatinine Ratio: 18 (ref 12–28)
BUN: 16 mg/dL (ref 8–27)
Bilirubin Total: 3 mg/dL — ABNORMAL HIGH (ref 0.0–1.2)
CO2: 22 mmol/L (ref 20–29)
Calcium: 9.6 mg/dL (ref 8.7–10.3)
Chloride: 103 mmol/L (ref 96–106)
Creatinine, Ser: 0.89 mg/dL (ref 0.57–1.00)
Globulin, Total: 1.8 g/dL (ref 1.5–4.5)
Glucose: 141 mg/dL — ABNORMAL HIGH (ref 70–99)
Potassium: 4.7 mmol/L (ref 3.5–5.2)
Sodium: 141 mmol/L (ref 134–144)
Total Protein: 6.9 g/dL (ref 6.0–8.5)
eGFR: 70 mL/min/1.73 (ref >59)

## 2024-02-24 LAB — CBC WITH DIFFERENTIAL/PLATELET
Basophils Absolute: 0 x10E3/uL (ref 0.0–0.2)
Basos: 0 %
EOS (ABSOLUTE): 0.1 x10E3/uL (ref 0.0–0.4)
Eos: 3 %
Hematocrit: 37.7 % (ref 34.0–46.6)
Hemoglobin: 12.7 g/dL (ref 11.1–15.9)
Immature Grans (Abs): 0 x10E3/uL (ref 0.0–0.1)
Immature Granulocytes: 0 %
Lymphocytes Absolute: 1.2 x10E3/uL (ref 0.7–3.1)
Lymphs: 25 %
MCH: 27.6 pg (ref 26.6–33.0)
MCHC: 33.7 g/dL (ref 31.5–35.7)
MCV: 82 fL (ref 79–97)
Monocytes Absolute: 0.2 x10E3/uL (ref 0.1–0.9)
Monocytes: 4 %
Neutrophils Absolute: 3.3 x10E3/uL (ref 1.4–7.0)
Neutrophils: 68 %
Platelets: 251 x10E3/uL (ref 150–450)
RBC: 4.6 x10E6/uL (ref 3.77–5.28)
RDW: 16.5 % — ABNORMAL HIGH (ref 11.7–15.4)
WBC: 4.9 x10E3/uL (ref 3.4–10.8)

## 2024-02-24 LAB — VITAMIN B12: Vitamin B-12: 462 pg/mL (ref 232–1245)

## 2024-02-24 LAB — RPR: RPR Ser Ql: NONREACTIVE

## 2024-02-24 LAB — C-REACTIVE PROTEIN: CRP: 2 mg/L (ref 0–10)

## 2024-02-24 LAB — ANA W/REFLEX IF POSITIVE: Anti Nuclear Antibody (ANA): NEGATIVE

## 2024-02-24 LAB — SEDIMENTATION RATE: Sed Rate: 2 mm/h (ref 0–40)

## 2024-02-24 LAB — TSH: TSH: 0.631 u[IU]/mL (ref 0.450–4.500)

## 2024-02-24 LAB — HGB A1C W/O EAG: Hgb A1c MFr Bld: 4.2 % — ABNORMAL LOW (ref 4.8–5.6)

## 2024-03-02 DIAGNOSIS — Z9189 Other specified personal risk factors, not elsewhere classified: Secondary | ICD-10-CM | POA: Diagnosis not present

## 2024-03-06 ENCOUNTER — Other Ambulatory Visit: Payer: Self-pay | Admitting: Nurse Practitioner

## 2024-03-06 DIAGNOSIS — E042 Nontoxic multinodular goiter: Secondary | ICD-10-CM

## 2024-03-14 ENCOUNTER — Inpatient Hospital Stay: Admission: RE | Admit: 2024-03-14 | Discharge: 2024-03-14 | Attending: Nurse Practitioner

## 2024-03-14 DIAGNOSIS — E042 Nontoxic multinodular goiter: Secondary | ICD-10-CM

## 2024-03-14 DIAGNOSIS — E041 Nontoxic single thyroid nodule: Secondary | ICD-10-CM | POA: Diagnosis not present
